# Patient Record
Sex: Female | Born: 1965 | Race: White | Hispanic: No | Marital: Married | Smoking: Former smoker
Health system: Southern US, Community
[De-identification: ages and names within clinical notes are randomized; demographics above are authoritative.]

## PROBLEM LIST (undated history)

## (undated) DIAGNOSIS — M1990 Unspecified osteoarthritis, unspecified site: Secondary | ICD-10-CM

## (undated) DIAGNOSIS — M4802 Spinal stenosis, cervical region: Secondary | ICD-10-CM

## (undated) DIAGNOSIS — F32A Depression, unspecified: Secondary | ICD-10-CM

## (undated) DIAGNOSIS — N809 Endometriosis, unspecified: Secondary | ICD-10-CM

## (undated) DIAGNOSIS — F419 Anxiety disorder, unspecified: Secondary | ICD-10-CM

## (undated) DIAGNOSIS — Z972 Presence of dental prosthetic device (complete) (partial): Secondary | ICD-10-CM

## (undated) DIAGNOSIS — F329 Major depressive disorder, single episode, unspecified: Secondary | ICD-10-CM

## (undated) DIAGNOSIS — I10 Essential (primary) hypertension: Secondary | ICD-10-CM

## (undated) DIAGNOSIS — R42 Dizziness and giddiness: Secondary | ICD-10-CM

## (undated) HISTORY — DX: Unspecified osteoarthritis, unspecified site: M19.90

## (undated) HISTORY — DX: Major depressive disorder, single episode, unspecified: F32.9

## (undated) HISTORY — PX: JOINT REPLACEMENT: SHX530

## (undated) HISTORY — DX: Spinal stenosis, cervical region: M48.02

## (undated) HISTORY — DX: Dizziness and giddiness: R42

## (undated) HISTORY — DX: Depression, unspecified: F32.A

## (undated) HISTORY — DX: Endometriosis, unspecified: N80.9

## (undated) HISTORY — DX: Essential (primary) hypertension: I10

---

## 2000-03-15 ENCOUNTER — Other Ambulatory Visit: Admission: RE | Admit: 2000-03-15 | Discharge: 2000-03-15 | Payer: Self-pay | Admitting: Obstetrics and Gynecology

## 2000-03-19 ENCOUNTER — Inpatient Hospital Stay (HOSPITAL_COMMUNITY): Admission: AD | Admit: 2000-03-19 | Discharge: 2000-03-19 | Payer: Self-pay | Admitting: Obstetrics and Gynecology

## 2000-09-05 ENCOUNTER — Other Ambulatory Visit: Admission: RE | Admit: 2000-09-05 | Discharge: 2000-09-05 | Payer: Self-pay | Admitting: Obstetrics and Gynecology

## 2001-01-01 ENCOUNTER — Observation Stay (HOSPITAL_COMMUNITY): Admission: RE | Admit: 2001-01-01 | Discharge: 2001-01-02 | Payer: Self-pay | Admitting: Obstetrics and Gynecology

## 2001-01-01 ENCOUNTER — Encounter (INDEPENDENT_AMBULATORY_CARE_PROVIDER_SITE_OTHER): Payer: Self-pay

## 2001-02-28 HISTORY — PX: TOTAL ABDOMINAL HYSTERECTOMY: SHX209

## 2002-02-28 HISTORY — PX: BACK SURGERY: SHX140

## 2002-07-02 ENCOUNTER — Encounter: Admission: RE | Admit: 2002-07-02 | Discharge: 2002-07-02 | Payer: Self-pay | Admitting: Neurosurgery

## 2002-07-02 ENCOUNTER — Encounter: Payer: Self-pay | Admitting: Neurosurgery

## 2002-08-08 ENCOUNTER — Inpatient Hospital Stay (HOSPITAL_COMMUNITY): Admission: RE | Admit: 2002-08-08 | Discharge: 2002-08-10 | Payer: Self-pay | Admitting: Neurosurgery

## 2002-08-08 ENCOUNTER — Encounter: Payer: Self-pay | Admitting: Neurosurgery

## 2002-11-28 ENCOUNTER — Encounter: Admission: RE | Admit: 2002-11-28 | Discharge: 2002-11-28 | Payer: Self-pay | Admitting: Neurosurgery

## 2002-11-28 ENCOUNTER — Encounter: Payer: Self-pay | Admitting: Neurosurgery

## 2004-05-19 ENCOUNTER — Emergency Department (HOSPITAL_COMMUNITY): Admission: EM | Admit: 2004-05-19 | Discharge: 2004-05-19 | Payer: Self-pay | Admitting: Emergency Medicine

## 2004-11-17 ENCOUNTER — Encounter: Admission: RE | Admit: 2004-11-17 | Discharge: 2004-11-17 | Payer: Self-pay | Admitting: Obstetrics and Gynecology

## 2004-12-02 ENCOUNTER — Other Ambulatory Visit: Admission: RE | Admit: 2004-12-02 | Discharge: 2004-12-02 | Payer: Self-pay | Admitting: Obstetrics and Gynecology

## 2008-08-22 ENCOUNTER — Ambulatory Visit: Payer: Self-pay | Admitting: Internal Medicine

## 2008-09-04 ENCOUNTER — Ambulatory Visit: Payer: Self-pay | Admitting: Internal Medicine

## 2010-04-01 NOTE — Miscellaneous (Signed)
Summary: PREVISIT/PREP/KCO  Clinical Lists Changes  Medications: Added new medication of MIRALAX   POWD (POLYETHYLENE GLYCOL 3350) As per prep  instructions. - Signed Added new medication of REGLAN 10 MG  TABS (METOCLOPRAMIDE HCL) As per prep instructions. - Signed Rx of MIRALAX   POWD (POLYETHYLENE GLYCOL 3350) As per prep  instructions.;  #255gm x 0;  Signed;  Entered by: Durwin Glaze RN;  Authorized by: Iva Boop MD;  Method used: [facility]  [place] [address], [place], [place]  [address], Ph: [phone] or [phone], Fax: [phone] Rx of REGLAN 10 MG  TABS (METOCLOPRAMIDE HCL) As per prep instructions.;  #2 x 0;  Signed;  Entered by: Durwin Glaze RN;  Authorized by: Iva Boop MD;  Method used: [facility]  [place] [address], [place], [place]  [address], Ph: [phone] or [phone], Fax: [phone] Observations: Added new observation of NKA: T (08/22/2008 15:15)    Prescriptions: REGLAN 10 MG  TABS (METOCLOPRAMIDE HCL) As per prep instructions.  #2 x 0   Entered by:   Durwin Glaze RN   Authorized by:   Iva Boop MD   Signed by:   Durwin Glaze RN on 08/22/2008   Method used:   [facility]        [facility]  [place] [address] (retail)       [address]       [place], [place]  [address]       Ph: [phone] or [phone]       Fax: [phone]   RxID:   [id number] MIRALAX   POWD (POLYETHYLENE GLYCOL 3350) As per prep  instructions.  #255gm x 0   Entered by:   Durwin Glaze RN   Authorized by:   Iva Boop MD   Signed by:   Durwin Glaze RN on 08/22/2008   Method used:   [facility]        [facility]  [place] [address] [facility]retail[facility]       [address]       [place], [place]  [address]       Ph: [phone] or [phone]       Fax: [phone]   RxID:   [id number]

## 2010-04-01 NOTE — Procedures (Signed)
Summary: Colonoscopy   Colonoscopy  Procedure date:  09/04/2008  Findings:      [facility]    Procedures Next Due Date:    Colonoscopy: 08/2013  COLONOSCOPY PROCEDURE REPORT  PATIENT:  Taylor Gillespie, Taylor Gillespie  MR#:  [id number] BIRTHDATE:   January 30, 1966, 42 yrs. old   GENDER:   female  ENDOSCOPIST:   Iva Boop, MD, [facility] Referred by: Richardean Chimera, M.D.  PROCEDURE DATE:  09/04/2008 PROCEDURE:  Colonoscopy [address] ASA CLASS:   Class I INDICATIONS: Elevated Risk Screening family history of colon cancer, father in 36's  MEDICATIONS:    Fentanyl 75 mcg IV, Versed 10 mg IV  DESCRIPTION OF PROCEDURE:   After the risks benefits and alternatives of the procedure were thoroughly explained, informed consent was obtained.  Digital rectal exam was performed and revealed no abnormalities.   The LB PCF-H180AL [id number] endoscope was introduced through the anus and advanced to the cecum in 2:43 minutes, which was identified by both the appendix and ileocecal valve, without limitations.  The quality of the prep was excellent, using MiraLax.  The instrument was then slowly withdrawn as the colon was fully examined in 9:10 minutes. <<PROCEDUREIMAGES>>      <<OLD IMAGES>>  FINDINGS:  A normal appearing cecum, ileocecal valve, and appendiceal orifice were identified. The ascending, hepatic flexure, transverse, splenic flexure, descending, sigmoid colon, and rectum appeared unremarkable.   Retroflexed views in the rectum revealed external hemorrhoids.    The scope was then withdrawn from the patient and the procedure completed.  COMPLICATIONS:   None  ENDOSCOPIC IMPRESSION:  1) Normal colon, excellent prep  2) External hemorrhoids in the rectum RECOMMENDATIONS:  Call [phone] to arrange appointment with Dr. Leone Payor re: GERD problems.  REPEAT EXAM:   In 5 year(s) for routine colonoscopy for family history of colon cancer.    Iva Boop, MD, [facility]  CC: The  Patient Richardean Chimera, MD Herb Grays, MD

## 2010-07-16 NOTE — Op Note (Signed)
[facility]  Patient:    Taylor Gillespie, Taylor Gillespie Visit Number: [id number] MRN: [id number]          Service Type: Attending:  Juluis Mire, M.D. Dictated by:   Juluis Mire, M.D. Proc. Date: 01/01/01                             Operative Report  PREOPERATIVE DIAGNOSES:       Pelvic pain, abnormal bleeding.  POSTOPERATIVE DIAGNOSIS:      Adenomyosis.  OPERATIVE PROCEDURE:          Laparoscopically assisted vaginal hysterectomy.  SURGEON:                      Juluis Mire, M.D.  ASSISTANTFreddy Finner, M.D.  ANESTHESIA:                   General endotracheal.  ESTIMATED BLOOD LOSS:         300 cc.  PACKS AND DRAINS:             None.  BLOOD REPLACEMENT:            None.  COMPLICATIONS:                None.  INDICATIONS:                  Are as dictated in history and physical.  PROCEDURE:                    The patient was taken to the OR and placed in a supine position.  After a satisfactory level of general endotracheal anesthesia was obtained the patient was placed in the dorsal lithotomy position using Allen stirrups.  The abdomen, perineum, and vagina were prepped out with Betadine and draped as a sterile field.  A speculum was placed in the vaginal vault.  The cervix was grasped with a single tooth tenaculum.  A Hulka tenaculum was then put in place.  Bladder was emptied with in-and-out catheterization.  Subumbilical incision was made with the knife.  Veress needle was introduced into the abdominal cavity.  The abdomen was insufflated with approximately 2 L of carbon dioxide.  The operating laparoscope was introduced.  Visualization revealed no evidence of injury to adjacent organs. A 5 mm trocar was put in place under direct visualization.  Appendix was normal.  Upper abdomen including liver and tip of the gallbladder were clear. Uterus was enlarged.  Tubes and ovaries were unremarkable.  There was no active  endometriosis or pelvic adhesions.  A third 5 mm trocar was put into place in the left lower quadrant after we did visualize the course of the epigastric vessels.  Next, a 5 mm laparoscope was brought into place in the left lower quadrant.  The LigaSure was then brought through the subumbilical port.  Using the LigaSure we were able to free up the left adnexa and the right adnexa from the uterus.  Next, the 5 mm scope was removed and we replaced the larger laparoscope to the subumbilical port.  Both adnexa were adequately released from the uterus.  There was no active bleeding and no evidence of injury to adjacent organs.  We thoroughly irrigate the pelvis.  At this point in time the  abdomen was desufflated of its carbon dioxide.  The laparoscope was removed.  The patients legs were then repositioned.  A weighted vaginal speculum was then placed in the vaginal vault.  The Hulka tenaculum was then removed.  The cervix was grasped with a Christella Hartigan tenaculum.  Cul-de-sac was entered sharply. Both uterosacral ligaments were clamped, cut, and suture ligated with 0 Vicryl.  The reflection of the vaginal mucosa anteriorly was then incised and the bladder was dissected superiorly.  Paracervical tissue was clamped, cut, and suture ligated with 0 Vicryl.  Vesicouterine space was entered sharply and the retractor was put in place to retract the bladder superiorly using the clamp, cut, and tie technique with suture ligatures of 0 Vicryl with parametrium serially separated from the sides of the uterus.  Uterus was then flipped, the remaining pedicle clamped and cut, and the uterus passed off the operative field.  Held pedicles were suture ligated with 0 Vicryl.  We had excellent hemostasis.  A uterosacral plication stick was performed using 0 Vicryl.  Vaginal mucosa was then closed in a vertical fashion with interrupted figure-of-eights of 0 Vicryl.  We had good hemostasis.  The weighted speculum was then  removed.  A sponge on a sponge stick was placed in the vaginal vault. The Foley was placed to straight drain retrieving an adequate amount of clear urine.  Patients legs were then repositioned.  The abdomen was reinsufflated with carbon dioxide.  Visualization with laparoscope and irrigation revealed no active bleeding.  All pedicles were intact and there was no evidence of injury to adjacent organs.  Abdomen was desufflated of its carbon dioxide.  All trocars were removed.  Subumbilical incision was closed with interrupted subcuticulars of 4-0 Vicryl.  Suprapubic incision was closed with Steri-Strips.  The sponge on a sponge stick was removed from the vaginal vault and the patient taken out of the dorsal lithotomy position.  Sponge, instrument, needle counts reported as correct by circulating nurse x 2.  The patient was extubated and transferred to recovery room in good condition. Dictated by:   Juluis Mire, M.D. Attending:  Juluis Mire, M.D. DD:  01/02/01 TD:  01/03/01 Job: [id number]

## 2010-07-16 NOTE — Discharge Summary (Signed)
[facility]  Patient:    Taylor Gillespie, Taylor Gillespie Visit Number: [id number] MRN: [id number]          Service Type: DSU Location: 910A 9121 01 Attending Physician:  Frederich Balding Dictated by:   Juluis Mire, M.D. Admit Date:  01/01/2001 Disc. Date: 01/02/01                             Discharge Summary  ADMITTING DIAGNOSES:          1. Pelvic pain.                               2. Abnormal bleeding.  DISCHARGE DIAGNOSES:          Adenomyosis with pathology pending.  OPERATIVE PROCEDURE:          Laparoscopically assisted vaginal hysterectomy.  HISTORY AND PHYSICAL:         For complete history and physical, please see dictated note.  HOSPITAL COURSE:              The patient underwent the above-noted surgery. Pathology is still pending. Postoperatively, the patient did well. Postoperative hemoglobin was 11.5. She was discharged home on her first postoperative day. At that time, she was tolerating liquids. Her abdominal exam was benign. She had no active bleeding and was voiding without difficulty.  In terms of complications, none were encountered during stay in the hospital. The patient was discharged home in stable condition.  DISPOSITION:                  Routine postoperative instructions have already been given. She is to avoid heavy lifting, vaginal entrance, or driving a car. She is to report increasing fever, nausea, vomiting, abdominal pain, or active vaginal bleeding.  DISCHARGE MEDICATIONS:        The patient may use Tylox as needed for pain.  DISCHARGE FOLLOWUP:           The patient is to follow up in the office in one week. Dictated by:   Juluis Mire, M.D. Attending Physician:  Frederich Balding DD:  01/02/01 TD:  01/02/01 Job: [id number]

## 2010-07-16 NOTE — Op Note (Signed)
NAME:  Taylor Gillespie, REIGER                       ACCOUNT NO.:  [id number]   MEDICAL RECORD NO.:  [id number]                   PATIENT TYPE:  INP   LOCATION:  3010                                 FACILITY:  [facility]   PHYSICIAN:  Donalee Citrin, M.D.                     DATE OF BIRTH:  [birth year]   DATE OF PROCEDURE:  08/08/2002  DATE OF DISCHARGE:                                 OPERATIVE REPORT   PREOPERATIVE DIAGNOSIS:  Grade 1 spondylolisthesis, L5-S1, with bilateral L5  radiculopathy and spinal instability.   PROCEDURES:  1. Decompressive laminectomy, L5-S1.  2. Gill decompression of the L5 nerve roots at L5.  3. Pedicle screw fixation at L5-S1.  4. Open reduction of spinal deformity, L5-S1.  5. Posterolateral arthrodesis, L5-S1, using locally-harvested autograft.   Instrumentation was Legacy M8 pedicle screw system with 6.5 x 40 pedicle  screws at L5 and 6.5 x 35 at S1, placement of a 322 crosslink.   SURGEON:  Donalee Citrin, M.D.   ASSISTANT:  Reinaldo Meeker, M.D.   ANESTHESIA:  General endotracheal.   HISTORY OF PRESENT ILLNESS:  The patient is a very pleasant 45 year old  female who has had long-standing back and leg pain consistent with an L5  radiculopathy.  The patient has been refractory to conservative treatment  with injections and physical therapy.  The pain got progressively worse over  the last several months.  Preop imaging showed grade 1 spondylolisthesis  with biforaminal stenosis at L5 and movement on flexion-extension films.  Due to failure of conservative treatment and her degree of instability, the  patient was recommended a decompression and stabilization procedure.  I  extensively went over the risks and benefits of surgery with her.  She  understands and agreed to proceed forward.   The patient was brought in the OR, was induced under general anesthesia and  positioned prone on the Wilson frame.  The back was prepped and draped in  the usual sterile  fashion.  Preoperative imaging localized the L5-S1 disk  space.  A midline incision was made after infiltration of 10 mL of lidocaine  with epinephrine.  Bovie electrocautery was used to take down through the  subcutaneous tissues.  Subperiosteal dissection carried out on the laminae  of L5 and S1 out to the TPs of L5 and S1 bilaterally.  The laminar complex  of L5 was noted to be hypermobile and very loose.  This was all removed in a  piecemeal fashion with a Leksell rongeur and Kerrison punches, exposing the  thecal sac.  The thecal sac was then decompressed and the L5 and S1 nerve  roots were identified and radically decompressed out their neural foramina.  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  Then the interspace was  identified, epidural veins were coagulated.  Attention was first  taken to  pedicle screw placement.  Using a high-speed drill, the pilot holes were  drilled, cannulated with the awl, tapped with a 5.5 tap, probed, and a 6.5 x  40 pedicle screw inserted at L5 bilaterally.  This procedure was repeated at  S1 with a 6.5 x 35 pedicle screw inserted.  Fluoroscopy confirmed the depth  and trajectory at each step along the way.  All the pedicles were probed and  noted to be competent in 360 degree orientation and direct intracanalicular  inspection confirmed no medial breach.  Then attention taken to the  interbody work.  The D'Errico nerve root retractor was used to reflect the  S1 nerve root medially.  The interspace on the right was radically cleaned  out and a size 12 distractor was inserted, fluoroscopy confirming this to be  the proper size of the bone graft.  Then on the left side the disk space was  cleaned out, cut, and chiseled with a size 12 chisel.  Then a 12 x 24 mm  Tangent allograft was inserted on the left.  Then attention was taken to the  right side.  Again the interspace was  prepared to receive the bone after  using a size 12 cutter and chisel and a downgoing Epstein curette, scraping  off the central end plates.  Locally-harvested autograft was packed against  the allograft on the left side.  On the right side a 12 x 24 mm Tangent  allograft was inserted.  Both allografts were inserted approximately 1-2 mm  deep to the posterior vertebral body line.  Position and depth confirmed  with fluoroscopy.  Then the wound was copiously irrigated.  Aggressive  decortication was carried out in the TPs bilaterally and the lateral  gutters.  During the interbody work the spondylolisthesis was reduced to  approximately half of its original slip, and then locally-harvested  autograft was packed down in the lateral gutters and along the TPs and the  size 40 mm rod was inserted, the top-tightening nuts were tightened down at  S1, and the L5 pedicle screw was compressed against S1.  The 322 crosslink  was placed.  The construct was noted to be solid.  The bone grafts were in  good position.  Both neural foramina were re-explored with an angled hockey  stick and noted to have no further stenosis.  No graft material was  appreciated in the canal.  Gelfoam was overlaid on top of the dura.  A  medium Hemovac drain was placed.  The muscle and fascia were reapproximated  with 0 interrupted Vicryl, the subcutaneous tissue was closed with 2-0  interrupted Vicryl, and the skin was closed with running 4-0 subcuticular.  Benzoin and Steri-Strips were applied.  The patient went to the recovery  room in stable condition.  At the end of the case all needle counts and  sponge counts were correct.                                               Donalee Citrin, M.D.    GC/MEDQ  D:  08/08/2002  T:  08/09/2002  Job:  [id number]

## 2010-07-16 NOTE — H&P (Signed)
[facility]  Patient:    Taylor Gillespie, Taylor Gillespie Visit Number: [id number] MRN: [id number]          Service Type: Attending:  Juluis Mire, M.D. Dictated by:   Juluis Mire, M.D. Adm. Date:  01/01/01                           History and Physical  HISTORY OF PRESENT ILLNESS:   The patient is a 45 year old gravida 1 para 1 married white female who presents for laparoscopically-assisted vaginal hysterectomy.  The patient has been troubled with increasing pelvic pain and abnormal bleeding.  She has been on birth control pills but having fairly significant abnormal periods with this.  She also has trouble with significant pain, particularly with intercourse.  This has become extremely limiting.  Pelvic ultrasounds have revealed simple ovarian cysts that have subsequently resolved but no active process.  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  It has been unresponsive to birth control pills.  The patient was desirous of proceeding with further management.  We have discussed with her further medical management versus outpatient laparoscopy versus definitive therapy in the form of hysterectomy, for which she is admitted at the present time.  ALLERGIES:                    No known drug allergies.  MEDICATIONS:                  Includes Celexa and birth control pills.  PAST MEDICAL HISTORY:         Usual childhood diseases without any significant sequela.  PAST SURGICAL HISTORY:        She has had no previous surgical history.  OBSTETRICAL HISTORY:          She has had one previous spontaneous vaginal delivery.  SOCIAL HISTORY:               Does reveal one pack every 20 days for past 17 years.  Occasional alcohol use.  FAMILY HISTORY:               Noncontributory.  REVIEW OF SYSTEMS:            Noncontributory.  PHYSICAL EXAMINATION:  VITAL SIGNS:                  Patient is afebrile with stable vital signs.  HEENT:                         Patient normocephalic.  Pupils equal, round, and reactive to light and accommodation.  Extraocular movements were intact. Sclerae and conjunctivae were clear.  Oropharynx clear.  NECK:                         Without thyromegaly.  BREASTS:                      No discrete masses.  LUNGS:                        Clear.  CARDIOVASCULAR:               Regular rate and rhythm, no murmurs or gallops.  ABDOMEN:                      Benign.  No masses, organomegaly, or tenderness.  PELVIC:                       Normal external genitalia, vaginal mucosa is clear.  Cervix unremarkable.  Uterus upper limits of normal size, moderate tenderness.  Adnexa unremarkable.  EXTREMITIES:                  Trace edema.  NEUROLOGIC:                   Grossly within normal limits.  IMPRESSION:                   Abnormal uterine bleeding and pelvic pain thought to be secondary to pelvic endometriosis.  PLAN:                         Again, options have been discussed, including further use of hormonal agents versus outpatient surgical management.  The patient is desirous of proceeding with definitive therapy in the form of laparoscopically-assisted vaginal hysterectomy.  The risks of surgery have been discussed, including the risk of anesthesia; the risk of infection; the risk of hemorrhage that could necessitate transfusion with the risk of AIDS or hepatitis; the risk of injury to adjacent organs including bladder, bowel, or ureters that could require further exploratory surgery; the risk of deep venous thrombosis and pulmonary embolus.  The patient professed an understanding of the indications and risks and is accepting of them.         The risks have been discussed, including the risk of anesthesia  the risk of infection  the risk of hemorrhage that could require transfusion with the risk of AIDS or hepatitis  the risk of injury to adjacent organs including bladder, bowel, or  ureters that could require further exploratory surgery  the risk of deep venous thrombosis and pulmonary embolus.  The patient professed an understanding of the indications and risks. Dictated by:   Juluis Mire, M.D. Attending:  Juluis Mire, M.D. DD:  01/01/01 TD:  01/01/01 Job: [id number]

## 2013-10-02 ENCOUNTER — Encounter: Payer: Self-pay | Admitting: Internal Medicine

## 2014-04-09 ENCOUNTER — Encounter: Payer: Self-pay | Admitting: Physician Assistant

## 2014-04-15 ENCOUNTER — Ambulatory Visit: Payer: Self-pay | Admitting: Physician Assistant

## 2014-04-24 ENCOUNTER — Encounter: Payer: Self-pay | Admitting: *Deleted

## 2014-04-25 ENCOUNTER — Ambulatory Visit (INDEPENDENT_AMBULATORY_CARE_PROVIDER_SITE_OTHER): Payer: BLUE CROSS/BLUE SHIELD | Admitting: Physician Assistant

## 2014-04-25 ENCOUNTER — Encounter: Payer: Self-pay | Admitting: Physician Assistant

## 2014-04-25 VITALS — BP 148/94 | HR 72 | Ht 67.0 in | Wt 260.0 lb

## 2014-04-25 DIAGNOSIS — Z800 Family history of malignant neoplasm of digestive organs: Secondary | ICD-10-CM

## 2014-04-25 DIAGNOSIS — F329 Major depressive disorder, single episode, unspecified: Secondary | ICD-10-CM

## 2014-04-25 DIAGNOSIS — I10 Essential (primary) hypertension: Secondary | ICD-10-CM | POA: Insufficient documentation

## 2014-04-25 DIAGNOSIS — R1314 Dysphagia, pharyngoesophageal phase: Secondary | ICD-10-CM

## 2014-04-25 DIAGNOSIS — F32A Depression, unspecified: Secondary | ICD-10-CM | POA: Insufficient documentation

## 2014-04-25 DIAGNOSIS — Z1211 Encounter for screening for malignant neoplasm of colon: Secondary | ICD-10-CM

## 2014-04-25 DIAGNOSIS — K219 Gastro-esophageal reflux disease without esophagitis: Secondary | ICD-10-CM

## 2014-04-25 DIAGNOSIS — G8929 Other chronic pain: Secondary | ICD-10-CM | POA: Insufficient documentation

## 2014-04-25 DIAGNOSIS — M549 Dorsalgia, unspecified: Secondary | ICD-10-CM

## 2014-04-25 MED ORDER — POLYETHYLENE GLYCOL 3350 17 GM/SCOOP PO POWD: 1.0000 | Freq: Every day | ORAL | Status: DC

## 2014-04-25 MED ORDER — PANTOPRAZOLE SODIUM 40 MG PO TBEC: 40.0000 mg | DELAYED_RELEASE_TABLET | Freq: Every day | ORAL | Status: DC

## 2014-04-25 NOTE — Progress Notes (Signed)
Patient ID: Taylor Gillespie, female   DOB: 11-10-1965, 49 y.o.   MRN: [id number]   Subjective:    Patient ID: Taylor Gillespie, female    DOB: 02-Jan-1966, 49 y.o.   MRN: [id number]  HPI Taylor Gillespie is a 49 year old white female known to Dr. Leone Payor from prior colonoscopy which was done in 2010 for screening. She has family history of colon cancer in her father diagnosed in his early 76s. She had a normal exam and was recommended to have 5 year interval follow-up. She comes in today for a new problem of chronic acid reflux. She says she has been having problems for years She had been placed on Protonix 40 mg by mouth every morning recently by her PCP and says that her heartburn and indigestion is now under good control. She says she still has to walk certain foods like caffeine coffee fried foods etc. area she had been having some problems with nausea and occasional vomiting and sour brash but those have resolved as well. Patient has been having dysphagia to pills and occasionally with dry foods especially bread and meat. She does not have regurgitation but generally has to stop eating and says the food will gradually go down. This seems to have increased over the past several months. She is worried that something is "wrong in her esophagus". She's also been having some sternal discomfort and chest discomfort which she says may be musculoskeletal. This started after she had done a lot of cleaning 1 day. Line She has no complaints of changes in her bowel habits does tend to have mild constipation. No melena or hematochezia. She tends to take NSAIDs on a regular basis for chronic back pain. She had been on Mobic which she has stopped and then switched from ibuprofen 2 naproxen because she thought this might be better for her stomach is generally taking this on a daily basis.  Review of Systems Pertinent positive and negative review of systems were noted in the above HPI section.  All other review of systems  was otherwise negative.  Outpatient Encounter Prescriptions as of 04/25/2014  Medication Sig  . calcium citrate-vitamin D (CITRACAL+D) 315-200 MG-UNIT per tablet Take 1 tablet by mouth 2 (two) times daily.  . cholecalciferol (VITAMIN D) 1000 UNITS tablet Take 1,000 Units by mouth daily. Take  2 tab daily  . FLUoxetine (PROZAC) 20 MG tablet Take 20 mg by mouth daily.  . fluticasone (FLONASE) 50 MCG/ACT nasal spray Place into both nostrils daily.  . hydrochlorothiazide (HYDRODIURIL) 25 MG tablet Take 25 mg by mouth daily. Take 1 tab as needed.  Marland Kitchen ibuprofen (ADVIL,MOTRIN) 200 MG tablet Take 200 mg by mouth every 6 (six) hours as needed.  . meloxicam (MOBIC) 15 MG tablet Take 15 mg by mouth daily.  . naproxen (NAPROSYN) 500 MG tablet Take 500 mg by mouth 2 (two) times daily with a meal.  . pantoprazole (PROTONIX) 40 MG tablet Take 1 tablet (40 mg total) by mouth daily.  . [DISCONTINUED] pantoprazole (PROTONIX) 40 MG tablet   . polyethylene glycol powder (GLYCOLAX/MIRALAX) powder Take 255 g by mouth daily.  . [DISCONTINUED] omeprazole (PRILOSEC OTC) 20 MG tablet Take 20 mg by mouth daily.  . [DISCONTINUED] Vitamin D, Ergocalciferol, (DRISDOL) 50000 UNITS CAPS capsule Take 50,000 Units by mouth every 7 (seven) days.   Allergies  Allergen Reactions  . Shellfish Allergy     Sea food allergy.   Patient Active Problem List   Diagnosis Date Noted  . HTN (  hypertension) 04/25/2014  . Depression 04/25/2014  . Chronic back pain 04/25/2014   History   Social History  . Marital Status: Married    Spouse Name: N/A  . Number of Children: 1  . Years of Education: N/A   Occupational History  . [employer]    Social History Main Topics  . Smoking status: Current Every Day Smoker  . Smokeless tobacco: Never Used  . Alcohol Use: 0.0 oz/week    0 Standard drinks or equivalent per week  . Drug Use: No  . Sexual Activity: Not on file   Other Topics Concern  . Not on file   Social History Narrative     Ms. Debroux's family history includes Colon cancer in her father; Diabetes in her mother; Diabetes Mellitus II in her father; Hypertension in her brother and sister.      Objective:    Filed Vitals:   04/25/14 0831  BP: 148/94  Pulse: 72    Physical Exam  well-developed white female in no acute distress, pleasant blood pressure 148/94 pulse 72 height 5 foot 7 weight 260, BMI 40.7. HEENT; nontraumatic normocephalic EOMI PERRLA sclera anicteric, Neck; supple no JVD, Cardiovascular; regular rate and rhythm with S1-S2 no murmur or gallop, Pulmonary ;clear bilaterally, she is tender to palpation of the chest wall and midsternum, Abdomen; obese soft and basically nontender no palpable inguinal hernia no mass or hepatosplenomegaly bowel  sounds are present., Rectal ;exam not done, Extremities; no clubbing cyanosis or edema skin warm and dry, Psych ;mood and affect appropriate       Assessment & Plan:   #1  49 yo female with Chronic GERD #2 solid food Dysphagia- r/o stricture #3 Family hx of colon cancer (Father early 6960's) - due for 5 year interval follow up #4 Constipation  Plan; Continue Protonix 40 mg po daiily Antireflux regimen Schedule for EGD with probable dilation  , and Colonoscopy with Dr. Leone PayorGessner . Procedures discussed in detail with pt and she is agreeable to proceed. Discussed increasing fiber in diet ,liberal fluids, and prn stool softeners or Miralax   Taylor Gillespie S Shinichi Anguiano PA-C 04/25/2014

## 2014-04-25 NOTE — Patient Instructions (Signed)
We sent refills  For the Protonix 40 mg. You have been scheduled for an endoscopy and colonoscopy. Please follow the written instructions given to you at your visit today. Please pick up your prep supplies at the pharmacy within the next 1-3 days.  [facility] [address]. If you use inhalers (even only as needed), please bring them with you on the day of your procedure. Your physician has requested that you go to www.startemmi.com and enter the access code given to you at your visit today. This web site gives a general overview about your procedure. However, you should still follow specific instructions given to you by our office regarding your preparation for the procedure.

## 2014-04-27 NOTE — Progress Notes (Signed)
Agree with Ms. Esterwood's assessment and plan. Carl E. Gessner, MD, FACG   

## 2014-05-15 ENCOUNTER — Encounter: Payer: Self-pay | Admitting: Internal Medicine

## 2014-05-30 HISTORY — PX: COLONOSCOPY: SHX174

## 2014-05-30 HISTORY — PX: UPPER GI ENDOSCOPY: SHX6162

## 2014-06-13 ENCOUNTER — Ambulatory Visit (AMBULATORY_SURGERY_CENTER): Payer: BLUE CROSS/BLUE SHIELD | Admitting: Internal Medicine

## 2014-06-13 ENCOUNTER — Encounter: Payer: Self-pay | Admitting: Internal Medicine

## 2014-06-13 VITALS — BP 114/78 | HR 73 | Temp 98.5°F | Resp 16

## 2014-06-13 DIAGNOSIS — Z1211 Encounter for screening for malignant neoplasm of colon: Secondary | ICD-10-CM

## 2014-06-13 DIAGNOSIS — K219 Gastro-esophageal reflux disease without esophagitis: Secondary | ICD-10-CM

## 2014-06-13 DIAGNOSIS — Z800 Family history of malignant neoplasm of digestive organs: Secondary | ICD-10-CM | POA: Diagnosis not present

## 2014-06-13 DIAGNOSIS — R1314 Dysphagia, pharyngoesophageal phase: Secondary | ICD-10-CM | POA: Diagnosis not present

## 2014-06-13 MED ORDER — SODIUM CHLORIDE 0.9 % IV SOLN
500.0000 mL | INTRAVENOUS | Status: DC
Start: 2014-06-13 — End: 2014-06-16

## 2014-06-13 NOTE — Progress Notes (Signed)
Report to PACU, RN, vss, BBS= Clear.  

## 2014-06-13 NOTE — Patient Instructions (Addendum)
Both exams were normal.  Stay on the pantoprazole (Protonix) to control heartburn.  Try to quit smoking - it will help you in many ways if you can.  Your next routine colonoscopy should be in 5 years - 2021.  I appreciate the opportunity to care for you. Iva Booparl E. Zyiah Withington, MD, FACG   YOU HAD AN ENDOSCOPIC PROCEDURE TODAY AT THE [facility]:   Refer to the procedure report that was given to you for any specific questions about what was found during the examination.  If the procedure report does not answer your questions, please call your gastroenterologist to clarify.  If you requested that your care partner not be given the details of your procedure findings, then the procedure report has been included in a sealed envelope for you to review at your convenience later.  YOU SHOULD EXPECT: Some feelings of bloating in the abdomen. Passage of more gas than usual.  Walking can help get rid of the air that was put into your GI tract during the procedure and reduce the bloating. If you had a lower endoscopy (such as a colonoscopy or flexible sigmoidoscopy) you may notice spotting of blood in your stool or on the toilet paper. If you underwent a bowel prep for your procedure, you may not have a normal bowel movement for a few days.  Please Note:  You might notice some irritation and congestion in your nose or some drainage.  This is from the oxygen used during your procedure.  There is no need for concern and it should clear up in a day or so.  SYMPTOMS TO REPORT IMMEDIATELY:   Following lower endoscopy (colonoscopy or flexible sigmoidoscopy):  Excessive amounts of blood in the stool  Significant tenderness or worsening of abdominal pains  Swelling of the abdomen that is new, acute  Fever of 100F or higher   Following upper endoscopy (EGD)  Vomiting of blood or coffee ground material  New chest pain or pain under the shoulder blades  Painful or persistently difficult  swallowing  New shortness of breath  Fever of 100F or higher  Black, tarry-looking stools  For urgent or emergent issues, a gastroenterologist can be reached at any hour by calling [phone].   DIET: Your first meal following the procedure should be a small meal and then it is ok to progress to your normal diet. Heavy or fried foods are harder to digest and may make you feel nauseous or bloated.  Likewise, meals heavy in dairy and vegetables can increase bloating.  Drink plenty of fluids but you should avoid alcoholic beverages for 24 hours.  ACTIVITY:  You should plan to take it easy for the rest of today and you should NOT DRIVE or use heavy machinery until tomorrow (because of the sedation medicines used during the test).    FOLLOW UP: Our staff will call the number listed on your records the next business day following your procedure to check on you and address any questions or concerns that you may have regarding the information given to you following your procedure. If we do not reach you, we will leave a message.  However, if you are feeling well and you are not experiencing any problems, there is no need to return our call.  We will assume that you have returned to your regular daily activities without incident.  If any biopsies were taken you will be contacted by phone or by letter within the next 1-3 weeks.  Please call us at [phone] if you have not heard about the biopsies in 3 weeks.    SIGNATURES/CONFIDENTIALITY: You and/or your care partner have signed paperwork which will be entered into your electronic medical record.  These signatures attest to the fact that that the information above on your After Visit Summary has been reviewed and is understood.  Full responsibility of the confidentiality of this discharge information lies with you and/or your care-partner.

## 2014-06-13 NOTE — Op Note (Signed)
[facility] [address] [place], [id number]   COLONOSCOPY PROCEDURE REPORT  PATIENT: Taylor Gillespie, Taylor A  MR#: [id number] BIRTHDATE: [birth year] , 48  yrs. old GENDER: female ENDOSCOPIST: Iva Booparl E Lexey Fletes, MD, [facility] PROCEDURE DATE:  06/13/2014 PROCEDURE:   Colonoscopy, screening First Screening Colonoscopy - Avg.  risk and is 50 yrs.  old or older - No.  Prior Negative Screening - Now for repeat screening. Less than 10 yrs Prior Negative Screening - Now for repeat screening.  Above average risk  History of Adenoma - Now for follow-up colonoscopy & has been > or = to 3 yrs.  N/A ASA CLASS:   Class III INDICATIONS:Screening for colonic neoplasia and FH Colon or Rectal Adenocarcinoma. MEDICATIONS: Residual sedation present, Propofol 90 mg IV, and Monitored anesthesia care  DESCRIPTION OF PROCEDURE:   After the risks benefits and alternatives of the procedure were thoroughly explained, informed consent was obtained.  The digital rectal exam revealed no abnormalities of the rectum.   The LB [id number]  endoscope was introduced through the anus and advanced to the cecum, which was identified by both the appendix and ileocecal valve. No adverse events experienced.   The quality of the prep was adequate (MiraLax was used)  The instrument was then slowly withdrawn as the colon was fully examined.      COLON FINDINGS: A normal appearing cecum, ileocecal valve, and appendiceal orifice were identified.  The ascending, transverse, descending, sigmoid colon, and rectum appeared unremarkable. Retroflexed views revealed no abnormalities. The time to cecum = 1.9 Withdrawal time = 7.9   The scope was withdrawn and the procedure completed. COMPLICATIONS: There were no immediate complications.  ENDOSCOPIC IMPRESSION: Normal colonoscopy  RECOMMENDATIONS: Repeat Colonoscopy in 5 years. (Family Hx CRCA)  eSigned:  Iva Booparl E Lailana Shira, MD, [facility] 06/13/2014 3:58 PM   cc: The  Patient

## 2014-06-13 NOTE — Op Note (Signed)
[facility] [address] [place], [id number]   ENDOSCOPY PROCEDURE REPORT  PATIENT: Taylor Gillespie, Taylor Gillespie  MR#: [id number] BIRTHDATE: 1965/07/11 , 48  yrs. old GENDER: female ENDOSCOPIST: Iva Booparl E Vonnetta Akey, MD, [facility] PROCEDURE DATE:  06/13/2014 PROCEDURE:  EGD, diagnostic ASA CLASS:     Class III INDICATIONS:  dysphagia. MEDICATIONS: Propofol 150 mg IV and Monitored anesthesia care TOPICAL ANESTHETIC: none  DESCRIPTION OF PROCEDURE: After the risks benefits and alternatives of the procedure were thoroughly explained, informed consent was obtained.  The LB [id number] endoscope was introduced through the mouth and advanced to the second portion of the duodenum , Without limitations.  The instrument was slowly withdrawn as the mucosa was fully examined.      EXAM: The esophagus and gastroesophageal junction were completely normal in appearance.  The stomach was entered and closely examined.The antrum, angularis, and lesser curvature were well visualized, including Gillespie retroflexed view of the cardia and fundus. The stomach wall was normally distensable.  The scope passed easily through the pylorus into the duodenum.  Retroflexed views revealed no abnormalities.     The scope was then withdrawn from the patient and the procedure completed.  COMPLICATIONS: There were no immediate complications.  ENDOSCOPIC IMPRESSION: Normal appearing esophagus and GE junction, the stomach was well visualized and normal in appearance, normal appearing duodenum  RECOMMENDATIONS: Continue PPI  every day - dysphagia resolved on PPI GERD diet stop smoking   eSigned:  Iva Booparl E Maybel Dambrosio, MD, [facility] 06/13/2014 3:56 PM    CC:The Patient

## 2014-06-16 ENCOUNTER — Telehealth: Payer: Self-pay | Admitting: *Deleted

## 2014-06-16 NOTE — Telephone Encounter (Signed)
No answer, message left for the patient. 

## 2014-07-25 ENCOUNTER — Encounter: Payer: Self-pay | Admitting: Internal Medicine

## 2015-05-30 ENCOUNTER — Other Ambulatory Visit: Payer: Self-pay | Admitting: Physician Assistant

## 2015-11-11 ENCOUNTER — Encounter: Payer: Self-pay | Admitting: Podiatry

## 2015-11-11 ENCOUNTER — Ambulatory Visit (INDEPENDENT_AMBULATORY_CARE_PROVIDER_SITE_OTHER): Payer: BLUE CROSS/BLUE SHIELD | Admitting: Podiatry

## 2015-11-11 ENCOUNTER — Ambulatory Visit (INDEPENDENT_AMBULATORY_CARE_PROVIDER_SITE_OTHER): Payer: BLUE CROSS/BLUE SHIELD

## 2015-11-11 VITALS — BP 141/94 | HR 92 | Resp 16 | Ht 67.0 in | Wt 239.0 lb

## 2015-11-11 DIAGNOSIS — M79671 Pain in right foot: Secondary | ICD-10-CM | POA: Diagnosis not present

## 2015-11-11 DIAGNOSIS — M79672 Pain in left foot: Secondary | ICD-10-CM

## 2015-11-11 DIAGNOSIS — R600 Localized edema: Secondary | ICD-10-CM | POA: Diagnosis not present

## 2015-11-11 DIAGNOSIS — M779 Enthesopathy, unspecified: Secondary | ICD-10-CM | POA: Diagnosis not present

## 2015-11-11 MED ORDER — TRIAMCINOLONE ACETONIDE 10 MG/ML IJ SUSP
10.0000 mg | Freq: Once | INTRAMUSCULAR | Status: AC
Start: 2015-11-11 — End: 2015-11-11
  Administered 2015-11-11: 10 mg

## 2015-11-11 NOTE — Progress Notes (Signed)
   Subjective:    Patient ID: Taylor Gillespie, female    DOB: [birth year], 50 y.o.   MRN: [id number]  HPI Chief Complaint  Patient presents with  . Foot Pain    Bilateral; plantar forefoot; back of heel; pt stated, "Has burning sensation on top and bottom of foot; swelling"; x3 weeks  . Rash    Right foot; dorsal; x2 weeks      Review of Systems  Constitutional: Positive for fatigue.  Endocrine: Positive for heat intolerance.  Musculoskeletal: Positive for gait problem.  All other systems reviewed and are negative.      Objective:   Physical Exam        Assessment & Plan:

## 2015-11-12 NOTE — Progress Notes (Signed)
Subjective:     Patient ID: Taylor Gillespie, female   DOB: 02-Sep-1965, 50 y.o.   MRN: [id number]  HPI patient states that she started to work on concrete floors and is developed increased pain in both feet with swelling and also moderate swelling in the ankles. States it's been really sore for the last few weeks   Review of Systems  All other systems reviewed and are negative.      Objective:   Physical Exam  Constitutional: She is oriented to person, place, and time.  Cardiovascular: Intact distal pulses.   Musculoskeletal: Normal range of motion.  Neurological: She is oriented to person, place, and time.  Skin: Skin is warm.  Nursing note and vitals reviewed.  neurovascular status intact muscle strength adequate range of motion within normal limits with patient found to have inflammatory changes third MPJ of the left foot second MPJ of the right foot with moderate pain in the adjacent metatarsals but not to the same degree. Mild elevation of the underlying digits is also noted and patient's found have good digital perfusion and is well oriented 3     Assessment:     Inflammatory capsulitis third MPJ left second MPJ right with pain and moderate metatarsalgia bilateral    Plan:     H&P and x-rays reviewed with patient and today I did a proximal nerve block of each area explaining procedure and risk and aspirated the third MPJ left second MPJ right getting out a small amount of clear fluid and then injected with quarter cc deck some Kenalog into each and applied padding. She is on a prednisone pack which she will stay on at this time and will be seen back and we'll probably require long-term orthotics  X-ray report indicates that there is slight digital deformities with no indications of fracture or arthritis

## 2015-11-25 ENCOUNTER — Ambulatory Visit (INDEPENDENT_AMBULATORY_CARE_PROVIDER_SITE_OTHER): Payer: BLUE CROSS/BLUE SHIELD | Admitting: Podiatry

## 2015-11-25 ENCOUNTER — Encounter: Payer: Self-pay | Admitting: Podiatry

## 2015-11-25 DIAGNOSIS — M79672 Pain in left foot: Secondary | ICD-10-CM

## 2015-11-25 DIAGNOSIS — M79671 Pain in right foot: Secondary | ICD-10-CM

## 2015-11-25 DIAGNOSIS — M779 Enthesopathy, unspecified: Secondary | ICD-10-CM

## 2015-11-26 NOTE — Progress Notes (Signed)
Subjective:     Patient ID: Taylor Gillespie, female   DOB: 04-01-1965, 50 y.o.   MRN: [id number]  HPI patient presents after having injections of the third left second right stating that she has less pain and inflammation than previously   Review of Systems     Objective:   Physical Exam Neurovascular status intact muscle strength adequate with inflammatory changes sub-capsules bilateral which is improved but still painful    Assessment:     Inflammatory capsulitis improving but painful    Plan:     Advised on physical therapy anti-inflammatories supportive shoes and reappoint to recheck

## 2017-11-08 ENCOUNTER — Other Ambulatory Visit: Payer: Self-pay

## 2017-11-08 ENCOUNTER — Emergency Department (HOSPITAL_COMMUNITY)
Admission: EM | Admit: 2017-11-08 | Discharge: 2017-11-08 | Disposition: A | Discharge status: Home / Self Care | Payer: BLUE CROSS/BLUE SHIELD | Attending: Emergency Medicine | Admitting: Emergency Medicine

## 2017-11-08 ENCOUNTER — Emergency Department (HOSPITAL_COMMUNITY): Payer: BLUE CROSS/BLUE SHIELD

## 2017-11-08 ENCOUNTER — Encounter (HOSPITAL_COMMUNITY): Payer: Self-pay | Admitting: *Deleted

## 2017-11-08 DIAGNOSIS — Z79899 Other long term (current) drug therapy: Secondary | ICD-10-CM | POA: Insufficient documentation

## 2017-11-08 DIAGNOSIS — I10 Essential (primary) hypertension: Secondary | ICD-10-CM | POA: Diagnosis not present

## 2017-11-08 DIAGNOSIS — M19041 Primary osteoarthritis, right hand: Secondary | ICD-10-CM | POA: Insufficient documentation

## 2017-11-08 DIAGNOSIS — M79641 Pain in right hand: Secondary | ICD-10-CM | POA: Diagnosis present

## 2017-11-08 DIAGNOSIS — F17200 Nicotine dependence, unspecified, uncomplicated: Secondary | ICD-10-CM | POA: Diagnosis not present

## 2017-11-08 MED ORDER — DEXAMETHASONE SODIUM PHOSPHATE 10 MG/ML IJ SOLN
10.0000 mg | Freq: Once | INTRAMUSCULAR | Status: AC
  Administered 2017-11-08: 10 mg via INTRAMUSCULAR
  Filled 2017-11-08: qty 1

## 2017-11-08 NOTE — ED Provider Notes (Signed)
Patient placed in Quick Look pathway, seen and evaluated   Chief Complaint: right hand pain  HPI:   Taylor Gillespie is a 52 y.o. female with hx of arthritis and and osteoporosis who presents to the ED with right thumb pain that started about a year ago and has gotten worse. She has tried a brace, taking Advil and has been to a [employer]or and the pain and swelling is worse. Patient tried to get appointment with them again but they could not see her. Patient getting ready to start a new job and needed to be seen before.   ROS: M/S: right hand pain  Physical Exam:  BP (!) 155/96 (BP Location: Left Arm)   Pulse 90   Temp 98.3 F (36.8 C) (Oral)   Resp 18   SpO2 98%    Gen: No distress  Neuro: Awake and Alert  Skin: Warm and dry  M/S: right thumb tender on exam, mild swelling, pain radiates to the right wrist. Radial pulse 2+, adequate circulation      Initiation of care has begun. The patient has been counseled on the process, plan, and necessity for staying for the completion/evaluation, and the remainder of the medical screening examination    Janne Napoleon, NP 11/08/17 1319    Vanetta Mulders, MD 11/11/17 [phone]

## 2017-11-08 NOTE — ED Notes (Signed)
Patient transported to X-ray 

## 2017-11-08 NOTE — ED Triage Notes (Signed)
Pt reports ongoing right thumb pain and swelling. Denies injury.

## 2017-11-08 NOTE — ED Provider Notes (Signed)
[facility] EMERGENCY DEPARTMENT Provider Note   CSN: [id number] Arrival date & time: 11/08/17  1230     History   Chief Complaint Chief Complaint  Patient presents with  . Hand Pain    HPI Taylor Gillespie is a 52 y.o. female with a past medical history of osteoarthritis and rheumatoid arthritis, hypertension, who presents to ED for evaluation of 1 day history of dominant right thumb pain radiating down her wrist.  States that she has had flareup of this pain several times in the past.  However, she is concerned because she starts a new job that would use hands on Monday.  She states that she tries to get in with her hand specialist but was unable to today.  She is requesting a steroid shot in the joint.  She thinks this will help her based on the fact that she has had in her foot before.  Denies any injuries or falls, numbness in hands or arms, history of gout, prior surgeries.  HPI  Past Medical History:  Diagnosis Date  . Arthritis   . Depression   . Endometriosis   . Hypertension     Patient Active Problem List   Diagnosis Date Noted  . HTN (hypertension) 04/25/2014  . Depression 04/25/2014  . Chronic back pain 04/25/2014    Past Surgical History:  Procedure Laterality Date  . BACK SURGERY  2004  . TOTAL ABDOMINAL HYSTERECTOMY  2003     OB History   None      Home Medications    Prior to Admission medications   Medication Sig Start Date End Date Taking? Authorizing Provider  calcium citrate-vitamin D (CITRACAL+D) 315-200 MG-UNIT per tablet Take 1 tablet by mouth 2 (two) times daily.    [provider]  cholecalciferol (VITAMIN D) 1000 UNITS tablet Take 1,000 Units by mouth daily. Take  2 tab daily    [provider]  FLUoxetine (PROZAC) 20 MG capsule  11/10/15   [provider]  ibuprofen (ADVIL,MOTRIN) 200 MG tablet Take 200 mg by mouth every 6 (six) hours as needed.    [provider]    lisinopril-hydrochlorothiazide (PRINZIDE,ZESTORETIC) 20-25 MG tablet  11/10/15   [provider]  naproxen (NAPROSYN) 500 MG tablet Take 500 mg by mouth 2 (two) times daily with a meal.    [provider]  pantoprazole (PROTONIX) 40 MG tablet TAKE 1 TABLET (40 MG TOTAL) BY MOUTH DAILY. 06/01/15   Iva Boop, MD  VIMOVO 500-20 MG TBEC  11/09/15   [provider]    Family History Family History  Problem Relation Age of Onset  . Colon cancer Father        deceased  . Diabetes Mellitus II Father   . Diabetes Mother        deceased  . Hypertension Brother   . Hypertension Sister        two sisters    Social History Social History   Tobacco Use  . Smoking status: Current Every Day Smoker  . Smokeless tobacco: Never Used  Substance Use Topics  . Alcohol use: Yes    Alcohol/week: 0.0 standard drinks  . Drug use: No     Allergies   Shellfish allergy   Review of Systems Review of Systems  Constitutional: Negative for chills and fever.  Musculoskeletal: Positive for arthralgias. Negative for joint swelling and myalgias.  Skin: Negative for wound.     Physical Exam Updated Vital Signs BP Marland Kitchen)  155/96 (BP Location: Left Arm)   Pulse 90   Temp 98.3 F (36.8 C) (Oral)   Resp 18   SpO2 98%   Physical Exam  Constitutional: She appears well-developed and well-nourished. No distress.  HENT:  Head: Normocephalic and atraumatic.  Eyes: Conjunctivae and EOM are normal. No scleral icterus.  Neck: Normal range of motion.  Pulmonary/Chest: Effort normal. No respiratory distress.  Musculoskeletal: She exhibits tenderness.  Tenderness to palpation of the metacarpal joint of the right thumb.  No significant erythema, edema or warmth of joint noted.  2+ radial pulse palpated.  Full active and passive range of motion of joints although limited due to pain at times.  Neurological: She is alert.  Skin: No rash noted. She is not diaphoretic.  Psychiatric:  She has a normal mood and affect.  Nursing note and vitals reviewed.    ED Treatments / Results  Labs (all labs ordered are listed, but only abnormal results are displayed) Labs Reviewed - No data to display  EKG None  Radiology Dg Hand Complete Right  Result Date: 11/08/2017 CLINICAL DATA:  Right thumb pain for 2 weeks, no known injury, initial encounter EXAM: RIGHT HAND - COMPLETE 3+ VIEW COMPARISON:  11/24/2009 FINDINGS: Mild degenerative changes noted at the base of the thumb involving the first [facility] joint. Interphalangeal degenerative changes are noted as well. These have progressed in the interval from the prior exam. Well corticated densities are noted adjacent to the first [facility] joint likely related to prior trauma. No acute fracture or dislocation is noted. No soft tissue changes are seen. IMPRESSION: Degenerative changes of the first [facility] joint are noted. No acute fracture is seen. Electronically Signed   By: Alcide Clever M.D.   On: 11/08/2017 14:32    Procedures Procedures (including critical care time)  Medications Ordered in ED Medications  dexamethasone (DECADRON) injection 10 mg (has no administration in time range)     Initial Impression / Assessment and Plan / ED Course  I have reviewed the triage vital signs and the nursing notes.  Pertinent labs & imaging results that were available during my care of the patient were reviewed by me and considered in my medical decision making (see chart for details).     52 year old female presents to ED for evaluation of flareup of pain from osteoarthritis and rheumatoid arthritis of her right thumb.  Symptoms have been going on since yesterday.  She was unable to get up with her hand specialist and is concerned because she starts a new job on Monday.  On exam there is no signs of infectious or vascular cause of symptoms.  She is able to move the wrist and digit without difficulty although there is some pain with certain movements.   X-ray shows degenerative changes of the joint.  No acute findings.  Will give patient IM Decadron to help systemically with inflammation other joints as well.  Will advise her to return to ED for any severe worsening symptoms.  Portions of this note were generated with Scientist, clinical (histocompatibility and immunogenetics). Dictation errors may occur despite best attempts at proofreading.   Final Clinical Impressions(s) / ED Diagnoses   Final diagnoses:  Primary osteoarthritis of right hand    ED Discharge Orders    None       Dietrich Pates, PA-C 11/08/17 1501    Virgina Norfolk, DO 11/08/17 2120

## 2017-11-08 NOTE — ED Notes (Signed)
Signature pad unavailable at time of pt discharge. Pt verbalized understanding of d/c instructions/prescriptions.  

## 2017-11-08 NOTE — Discharge Instructions (Signed)
Return to ED for worsening symptoms, injuries or falls, numbness in arms or legs, red hot or tender joint.

## 2018-08-24 ENCOUNTER — Other Ambulatory Visit: Payer: Self-pay | Admitting: Otolaryngology

## 2018-08-24 DIAGNOSIS — R591 Generalized enlarged lymph nodes: Secondary | ICD-10-CM

## 2018-10-16 ENCOUNTER — Encounter: Payer: Self-pay | Admitting: Diagnostic Neuroimaging

## 2018-10-16 ENCOUNTER — Other Ambulatory Visit: Payer: Self-pay | Admitting: *Deleted

## 2018-10-17 ENCOUNTER — Ambulatory Visit: Payer: BC Managed Care – PPO | Admitting: Diagnostic Neuroimaging

## 2018-10-17 ENCOUNTER — Encounter: Payer: Self-pay | Admitting: Diagnostic Neuroimaging

## 2018-10-17 ENCOUNTER — Telehealth: Payer: Self-pay | Admitting: Diagnostic Neuroimaging

## 2018-10-17 ENCOUNTER — Other Ambulatory Visit: Payer: Self-pay | Admitting: Neurosurgery

## 2018-10-17 ENCOUNTER — Other Ambulatory Visit: Payer: Self-pay

## 2018-10-17 VITALS — BP 137/82 | HR 79 | Temp 98.4°F | Ht 67.0 in | Wt 256.0 lb

## 2018-10-17 DIAGNOSIS — R200 Anesthesia of skin: Secondary | ICD-10-CM | POA: Diagnosis not present

## 2018-10-17 DIAGNOSIS — R202 Paresthesia of skin: Secondary | ICD-10-CM | POA: Diagnosis not present

## 2018-10-17 DIAGNOSIS — G450 Vertebro-basilar artery syndrome: Secondary | ICD-10-CM

## 2018-10-17 NOTE — Patient Instructions (Signed)
Thank you for coming to see [place] at [facility]. I hope we have been able to provide you high quality care today.  You may receive a patient satisfaction survey over the next few weeks. We would appreciate your feedback and comments so that we may continue to improve ourselves and the health of our patients.  - check labs and MRI   ~~~~~~~~~~~~~~~~~~~~~~~~~~~~~~~~~~~~~~~~~~~~~~~~~~~~~~~~~~~~~~~~~  DR. Miosotis Wetsel'S GUIDE TO HAPPY AND HEALTHY LIVING These are some of my general health and wellness recommendations. Some of them may apply to you better than others. Please use common sense as you try these suggestions and feel free to ask me any questions.   ACTIVITY/FITNESS Mental, social, emotional and physical stimulation are very important for brain and body health. Try learning a new activity (arts, music, language, sports, games).  Keep moving your body to the best of your abilities. You can do this at home, inside or outside, the park, community center, gym or anywhere you like. Consider a physical therapist or personal trainer to get started. Fitness trackers, smart-watches or  smart-phones can help as well.   NUTRITION Eat more plants: colorful vegetables, nuts, seeds and berries.  Eat less sugar, salt, preservatives and processed foods.  Avoid toxins such as cigarettes and alcohol.  Drink water when you are thirsty. Warm water with a slice of lemon is an excellent morning drink to start the day.  Consider these websites for more information The Nutrition Source (https://www.henry-hernandez.biz/) Precision Nutrition (WindowBlog.ch)   RELAXATION Consider practicing mindfulness meditation or other relaxation techniques such as deep breathing, prayer, yoga, tai chi, massage. See website mindful.org or the apps Headspace or Calm to help get started.   SLEEP Try to get at least 7-8+ hours sleep per day. Regular exercise  and reduced caffeine will help you sleep better. Practice good sleep hygeine techniques. See website sleep.org for more information.   PLANNING Prepare estate planning, living will, healthcare POA documents. Sometimes this is best planned with the help of an attorney. Theconversationproject.org and agingwithdignity.org are excellent resources.

## 2018-10-17 NOTE — Progress Notes (Signed)
[facility]  PATIENT: Taylor Gillespie DOB: [birth year]  REFERRING CLINICIAN: Ramos HISTORY FROM: patient and sister  REASON FOR VISIT: new consult    HISTORICAL  CHIEF COMPLAINT:  Chief Complaint  Patient presents with  . Dizziness    rm 7 New Pt, sister- Britta MccreedyBarbara, "dizzy, spinal stenosis in neck, ear pain throat hurts; tingling/pins/needles in R hand ring finger, pinky finger; shoulders ache; feet get hot; neck aches"    HISTORY OF PRESENT ILLNESS:   53 year old female here for evaluation of dizziness, tingling sensation.   March 2020 patient had onset of gait and balance difficulty.  When she would get out of her car or stand up she would feel "dizzy".  She describes a off balance, lightheaded, confusion sensation.  Symptoms can occur if she moves too quickly, stands up quickly, walks.  She also has significant neck pain, shoulder pain, hip pain.  Patient has significant insomnia, depression, fatigue.  She feels swelling and aching sensation in her neck.  No vertigo, spinning sensation, nausea or vomiting.  Patient has "tingling sensations" in her fingers and feet.  Patient has history of lumbar spine surgery.  Patient has arthritis changes in her hands, hips, knees and shoulders.  Patient has seen pain management clinic, neurosurgery clinic, ENT.  MRI cervical spine shows mild spinal stenosis and multilevel foraminal stenosis.    REVIEW OF SYSTEMS: Full 14 system review of systems performed and negative with exception of: Weight gain fatigue ringing in ears snoring insomnia dizziness confusion depression not no sleep aching muscles smoking.  ALLERGIES: Allergies  Allergen Reactions  . Morphine Nausea And Vomiting  . Lisinopril Cough  . Shellfish Allergy Rash    Sea food allergy. Sea food allergy.    HOME MEDICATIONS: Outpatient Medications Prior to Visit  Medication Sig Dispense Refill  . cholecalciferol (VITAMIN D) 1000 UNITS tablet Take  1,000 Units by mouth daily. Take  2 tab daily    . Cyanocobalamin 500 MCG CHEW Chew by mouth.    Marland Kitchen. FLUoxetine (PROZAC) 20 MG capsule     . ibuprofen (ADVIL,MOTRIN) 200 MG tablet Take 200 mg by mouth every 6 (six) hours as needed.    Marland Kitchen. losartan (COZAAR) 50 MG tablet 25 mg daily.    . Magnesium 500 MG CAPS Take by mouth.    . naproxen (NAPROSYN) 500 MG tablet Take 500 mg by mouth 2 (two) times daily with a meal.    . pantoprazole (PROTONIX) 40 MG tablet TAKE 1 TABLET (40 MG TOTAL) BY MOUTH DAILY. 30 tablet 5  . Potassium 99 MG TABS Take 99 mg by mouth daily.    Marland Kitchen. UNABLE TO FIND Med Name: tumeric 1500 mg every two days    . calcium citrate-vitamin D (CITRACAL+D) 315-200 MG-UNIT per tablet Take 1 tablet by mouth 2 (two) times daily.    Marland Kitchen. lisinopril-hydrochlorothiazide (PRINZIDE,ZESTORETIC) 20-25 MG tablet     . VIMOVO 500-20 MG TBEC      No facility-administered medications prior to visit.     PAST MEDICAL HISTORY: Past Medical History:  Diagnosis Date  . Arthritis   . Depression   . Dizziness   . Dizziness and giddiness   . Endometriosis   . Hypertension   . Osteoarthritis   . Spinal stenosis, cervical region    C5-6, C6-7    PAST SURGICAL HISTORY: Past Surgical History:  Procedure Laterality Date  . BACK SURGERY  2004  . TOTAL ABDOMINAL HYSTERECTOMY  2003    FAMILY  HISTORY: Family History  Problem Relation Age of Onset  . Colon cancer Father        deceased  . Diabetes Mellitus II Father   . Hypertension Father   . Diabetes Mother        deceased  . Arthritis Mother   . Hypertension Mother   . Hypertension Brother   . Hypertension Sister        two sisters  . Breast cancer Maternal Grandmother     SOCIAL HISTORY: Social History   Socioeconomic History  . Marital status: Married    Spouse name: Not on file  . Number of children: 1  . Years of education: 49  . Highest education level: Not on file  Occupational History    Comment: na  Social Needs  .  Financial resource strain: Not on file  . Food insecurity    Worry: Not on file    Inability: Not on file  . Transportation needs    Medical: Not on file    Non-medical: Not on file  Tobacco Use  . Smoking status: Current Every Day Smoker    Packs/day: 0.25    Years: 30.00    Pack years: 7.50    Types: Cigarettes  . Smokeless tobacco: Never Used  . Tobacco comment: 10/17/18 1 pk in 3 days  Substance and Sexual Activity  . Alcohol use: Yes    Alcohol/week: 0.0 standard drinks    Comment: occas  . Drug use: No  . Sexual activity: Not on file  Lifestyle  . Physical activity    Days per week: Not on file    Minutes per session: Not on file  . Stress: Not on file  Relationships  . Social Herbalist on phone: Not on file    Gets together: Not on file    Attends religious service: Not on file    Active member of club or organization: Not on file    Attends meetings of clubs or organizations: Not on file    Relationship status: Not on file  . Intimate partner violence    Fear of current or ex partner: Not on file    Emotionally abused: Not on file    Physically abused: Not on file    Forced sexual activity: Not on file  Other Topics Concern  . Not on file  Social History Narrative   Lives with spouse   Caffeine- coffee 3 cups daily     PHYSICAL EXAM  GENERAL EXAM/CONSTITUTIONAL: Vitals:  Vitals:   10/17/18 0839  BP: 137/82  Pulse: 79  Temp: 98.4 F (36.9 C)  Weight: 256 lb (116.1 kg)  Height: 5\' 7"  (1.702 m)     Body mass index is 40.1 kg/m. Wt Readings from Last 3 Encounters:  10/17/18 256 lb (116.1 kg)  11/11/15 239 lb (108.4 kg)  04/25/14 260 lb (117.9 kg)     Patient is in no distress; well developed, nourished and groomed  TIRED APPEARING  CARDIOVASCULAR:  Examination of carotid arteries is normal; no carotid bruits  Regular rate and rhythm, no murmurs  Examination of peripheral vascular system by observation and palpation is  normal  EYES:  Ophthalmoscopic exam of optic discs and posterior segments is normal; no papilledema or hemorrhages  No exam data present  MUSCULOSKELETAL:  Gait, strength, tone, movements noted in Neurologic exam below  NEUROLOGIC: MENTAL STATUS:  No flowsheet data found.  awake, alert, oriented to person, place and time  recent and remote memory intact  normal attention and concentration  language fluent, comprehension intact, naming intact  fund of knowledge appropriate  CRANIAL NERVE:   2nd - no papilledema on fundoscopic exam  2nd, 3rd, 4th, 6th - pupils equal and reactive to light, visual fields full to confrontation, extraocular muscles intact, no nystagmus  5th - facial sensation symmetric  7th - facial strength symmetric  8th - hearing intact  9th - palate elevates symmetrically, uvula midline  11th - shoulder shrug symmetric  12th - tongue protrusion midline  MOTOR:   normal bulk and tone, full strength in the BUE, BLE  SENSORY:   normal and symmetric to light touch, pinprick, temperature, vibration  COORDINATION:   finger-nose-finger, fine finger movements normal  REFLEXES:   deep tendon reflexes present and symmetric  GAIT/STATION:   narrow based gait; able to walk on toes, heels and tandem; romberg is negative     DIAGNOSTIC DATA (LABS, IMAGING, TESTING) - I reviewed patient records, labs, notes, testing and imaging myself where available.  No results found for: WBC, HGB, HCT, MCV, PLT No results found for: NA, K, CL, CO2, GLUCOSE, BUN, CREATININE, CALCIUM, PROT, ALBUMIN, AST, ALT, ALKPHOS, BILITOT, GFRNONAA, GFRAA No results found for: CHOL, HDL, LDLCALC, LDLDIRECT, TRIG, CHOLHDL No results found for: [id number] No results found for: VITAMINB12 No results found for: TSH   08/28/18 MRI cervical spine [I reviewed images myself and agree with interpretation. -VRP]  -Mild spinal stenosis at C5-6 and C6-7; foraminal stenosis at C5-6  and C6-7     ASSESSMENT AND PLAN  53 y.o. year old female here with constellation of symptoms including lightheadedness, gait difficulty, confusion, depression, insomnia, chronic pain.  History notable for smoking, weight gain, decreased activity, deconditioning.  No single unifying cause at this time.  Most likely presents combination of factors.  Dx:  1. 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 MR BRAIN W WO CONTRAST  . CBC with Differential/Platelet  . Comprehensive metabolic panel  . Vitamin B12  . Hemoglobin A1c  . TSH  . ANA,IFA RA Diag Pnl w/rflx Tit/Patn   Return pending test, for pending if symptoms worsen or fail to improve.    Suanne MarkerVIKRAM R. Eljay Lave, MD 10/17/2018, 9:07 AM Certified in Neurology, Neurophysiology and Neuroimaging  [facility] [address], [address] [place], [place] [phone]

## 2018-10-17 NOTE — Telephone Encounter (Signed)
Patient has [facility] faxed the order to them they will reach out to the patient to schedule.

## 2018-10-19 LAB — COMPREHENSIVE METABOLIC PANEL
ALT: 39 IU/L — ABNORMAL HIGH (ref 0–32)
AST: 28 IU/L (ref 0–40)
Albumin/Globulin Ratio: 1.8 (ref 1.2–2.2)
Albumin: 4.7 g/dL (ref 3.8–4.9)
Alkaline Phosphatase: 87 IU/L (ref 39–117)
BUN/Creatinine Ratio: 13 (ref 9–23)
BUN: 11 mg/dL (ref 6–24)
Bilirubin Total: 0.3 mg/dL (ref 0.0–1.2)
CO2: 25 mmol/L (ref 20–29)
Calcium: 9.6 mg/dL (ref 8.7–10.2)
Chloride: 99 mmol/L (ref 96–106)
Creatinine, Ser: 0.85 mg/dL (ref 0.57–1.00)
GFR calc Af Amer: 92 mL/min/{1.73_m2} (ref >59)
GFR calc non Af Amer: 80 mL/min/{1.73_m2} (ref >59)
Globulin, Total: 2.6 g/dL (ref 1.5–4.5)
Glucose: 104 mg/dL — ABNORMAL HIGH (ref 65–99)
Potassium: 4.5 mmol/L (ref 3.5–5.2)
Sodium: 139 mmol/L (ref 134–144)
Total Protein: 7.3 g/dL (ref 6.0–8.5)

## 2018-10-19 LAB — CBC WITH DIFFERENTIAL/PLATELET
Basophils Absolute: 0.1 10*3/uL (ref 0.0–0.2)
Basos: 1 %
EOS (ABSOLUTE): 0.2 10*3/uL (ref 0.0–0.4)
Eos: 2 %
Hematocrit: 44.2 % (ref 34.0–46.6)
Hemoglobin: 14.8 g/dL (ref 11.1–15.9)
Immature Grans (Abs): 0 10*3/uL (ref 0.0–0.1)
Immature Granulocytes: 0 %
Lymphocytes Absolute: 3.4 10*3/uL — ABNORMAL HIGH (ref 0.7–3.1)
Lymphs: 37 %
MCH: 29.3 pg (ref 26.6–33.0)
MCHC: 33.5 g/dL (ref 31.5–35.7)
MCV: 88 fL (ref 79–97)
Monocytes Absolute: 0.4 10*3/uL (ref 0.1–0.9)
Monocytes: 5 %
Neutrophils Absolute: 5.1 10*3/uL (ref 1.4–7.0)
Neutrophils: 55 %
Platelets: 314 10*3/uL (ref 150–450)
RBC: 5.05 x10E6/uL (ref 3.77–5.28)
RDW: 14.5 % (ref 11.7–15.4)
WBC: 9.1 10*3/uL (ref 3.4–10.8)

## 2018-10-19 LAB — ANA,IFA RA DIAG PNL W/RFLX TIT/PATN
ANA Titer 1: NEGATIVE
Cyclic Citrullin Peptide Ab: 22 units — ABNORMAL HIGH (ref 0–19)
Rhuematoid fact SerPl-aCnc: <10 IU/mL (ref 0.0–13.9)

## 2018-10-19 LAB — VITAMIN B12: Vitamin B-12: 640 pg/mL (ref 232–1245)

## 2018-10-19 LAB — HEMOGLOBIN A1C
Est. average glucose Bld gHb Est-mCnc: 120 mg/dL
Hgb A1c MFr Bld: 5.8 % — ABNORMAL HIGH (ref 4.8–5.6)

## 2018-10-19 LAB — TSH: TSH: 2.64 u[IU]/mL (ref 0.450–4.500)

## 2018-10-23 ENCOUNTER — Telehealth: Payer: Self-pay | Admitting: *Deleted

## 2018-10-23 NOTE — Telephone Encounter (Signed)
No acute findings. -VRP

## 2018-10-23 NOTE — Telephone Encounter (Addendum)
Received call back from patient. Advised her the MRI from [facility] was okay, no acute findings. Advised her overall her  labs are ok; minor changes are borderline findings. Dr Leta Baptist recommends she continue current plan.  I reviewed his plan per office note. She stated she would let Dr Nelva Bush know the MRI results, verbalized understanding, appreciation.

## 2018-10-23 NOTE — Telephone Encounter (Signed)
Received MRI brain report from [facility] On Dr [facility] desk for review.

## 2018-10-23 NOTE — Telephone Encounter (Signed)
LVM requesting call back for results. 

## 2018-11-09 ENCOUNTER — Other Ambulatory Visit: Payer: Self-pay | Admitting: Neurosurgery

## 2018-11-28 ENCOUNTER — Encounter (HOSPITAL_COMMUNITY)
Admission: RE | Admit: 2018-11-28 | Discharge: 2018-11-28 | Disposition: A | Discharge status: Home / Self Care | Payer: BC Managed Care – PPO | Source: Ambulatory Visit | Attending: Neurosurgery | Admitting: Neurosurgery

## 2018-11-28 ENCOUNTER — Other Ambulatory Visit: Payer: Self-pay

## 2018-11-28 ENCOUNTER — Other Ambulatory Visit: Payer: Self-pay | Admitting: Neurosurgery

## 2018-11-28 ENCOUNTER — Encounter (HOSPITAL_COMMUNITY): Payer: Self-pay

## 2018-11-28 DIAGNOSIS — Z01818 Encounter for other preprocedural examination: Secondary | ICD-10-CM | POA: Insufficient documentation

## 2018-11-28 HISTORY — DX: Presence of dental prosthetic device (complete) (partial): Z97.2

## 2018-11-28 LAB — BASIC METABOLIC PANEL
Anion gap: 10 (ref 5–15)
BUN: 9 mg/dL (ref 6–20)
CO2: 28 mmol/L (ref 22–32)
Calcium: 9.4 mg/dL (ref 8.9–10.3)
Chloride: 104 mmol/L (ref 98–111)
Creatinine, Ser: 0.87 mg/dL (ref 0.44–1.00)
GFR calc Af Amer: >60 mL/min (ref >60)
GFR calc non Af Amer: >60 mL/min (ref >60)
Glucose, Bld: 109 mg/dL — ABNORMAL HIGH (ref 70–99)
Potassium: 4 mmol/L (ref 3.5–5.1)
Sodium: 142 mmol/L (ref 135–145)

## 2018-11-28 LAB — SURGICAL PCR SCREEN
MRSA, PCR: NEGATIVE
Staphylococcus aureus: NEGATIVE

## 2018-11-28 LAB — CBC
HCT: 45.1 % (ref 36.0–46.0)
Hemoglobin: 14.5 g/dL (ref 12.0–15.0)
MCH: 29.2 pg (ref 26.0–34.0)
MCHC: 32.2 g/dL (ref 30.0–36.0)
MCV: 90.9 fL (ref 80.0–100.0)
Platelets: 266 10*3/uL (ref 150–400)
RBC: 4.96 MIL/uL (ref 3.87–5.11)
RDW: 13.6 % (ref 11.5–15.5)
WBC: 9.2 10*3/uL (ref 4.0–10.5)
nRBC: 0 % (ref 0.0–0.2)

## 2018-11-28 LAB — TYPE AND SCREEN
ABO/RH(D): O POS
Antibody Screen: NEGATIVE

## 2018-11-28 LAB — ABO/RH: ABO/RH(D): O POS

## 2018-11-28 NOTE — Progress Notes (Signed)
Patient denies shortness of breath, fever, cough and chest pain at PAT appointment  PCP - Nicki Reaper, [place] [facility] Cardiologist - Denies  Chest x-ray - Denies EKG - 11/28/18 Stress Test - Denies ECHO - Denies Cardiac Cath - Denies  Anesthesia review: No  7 days prior to surgery STOP taking any Aspirin (unless otherwise instructed by your surgeon), Aleve, Naproxen, Ibuprofen, Motrin, Advil, Goody's, BC's, all herbal medications, fish oil, and all vitamins.   Coronavirus Screening Have you or your husband experienced the following symptoms:  Cough yes/no: No Fever (>100.73F)  yes/no: No Runny nose yes/no: No Sore throat yes/no: No Difficulty breathing/shortness of breath  yes/no: No  Have you or your husband traveled in the last 14 days and where? yes/no: No   Patient verbalized understanding of instructions that were given to them at the PAT appointment.

## 2018-11-28 NOTE — Progress Notes (Signed)
[id number] - [place], [place] - [address] [place] [address] Phone: [phone] Fax: [phone]      Your procedure is scheduled on Wednesday, 10/7.  Report to [facility] Main Entrance "A" at 6:30 A.M., and check in at the Admitting office.  Call this number if you have problems the morning of surgery:  [phone]  Call [phone] if you have any questions prior to your surgery date Monday-Friday 8am-4pm    Remember:  Do not eat or drink after midnight the night before your surgery   Take these medicines the morning of surgery with A SIP OF WATER : pantoprazole (PROTONIX)  7 days prior to surgery STOP taking any Aspirin (unless otherwise instructed by your surgeon), Aleve, Naproxen, Ibuprofen, Motrin, Advil, Goody's, BC's, all herbal medications, fish oil, and all vitamins.    The Morning of Surgery  Do not wear jewelry, make-up or nail polish.  Do not wear lotions, powders, perfumes, or deodorant  Do not shave 48 hours prior to surgery.    Do not bring valuables to the hospital.  [facility] is not responsible for any belongings or valuables.  If you are a smoker, DO NOT Smoke 24 hours prior to surgery IF you wear a CPAP at night please bring your mask, tubing, and machine the morning of surgery   Remember that you must have someone to transport you home after your surgery, and remain with you for 24 hours if you are discharged the same day.   Contacts, glasses, hearing aids, dentures or bridgework may not be worn into surgery.    Leave your suitcase in the car.  After surgery it may be brought to your room.  For patients admitted to the hospital, discharge time will be determined by your treatment team.  Patients discharged the day of surgery will not be allowed to drive home.    Special instructions:   [place]- Preparing For Surgery  Before surgery, you can play an important  role. Because skin is not sterile, your skin needs to be as free of germs as possible. You can reduce the number of germs on your skin by washing with CHG (chlorahexidine gluconate) Soap before surgery.  CHG is an antiseptic cleaner which kills germs and bonds with the skin to continue killing germs even after washing.    Oral Hygiene is also important to reduce your risk of infection.  Remember - BRUSH YOUR TEETH THE MORNING OF SURGERY WITH YOUR REGULAR TOOTHPASTE  Please do not use if you have an allergy to CHG or antibacterial soaps. If your skin becomes reddened/irritated stop using the CHG.  Do not shave (including legs and underarms) for at least 48 hours prior to first CHG shower. It is OK to shave your face.  Please follow these instructions carefully.   1. Shower the NIGHT BEFORE SURGERY and the MORNING OF SURGERY with CHG Soap.   2. If you chose to wash your hair, wash your hair first as usual with your normal shampoo.  3. After you shampoo, rinse your hair and body thoroughly to remove the shampoo.  4. Use CHG as you would any other liquid soap. You can apply CHG directly to the skin and wash gently with a scrungie or a clean washcloth.   5. Apply the CHG Soap to your body ONLY FROM THE NECK DOWN.  Do not use on open wounds or open sores. Avoid  contact with your eyes, ears, mouth and genitals (private parts). Wash Face and genitals (private parts)  with your normal soap.   6. Wash thoroughly, paying special attention to the area where your surgery will be performed.  7. Thoroughly rinse your body with warm water from the neck down.  8. DO NOT shower/wash with your normal soap after using and rinsing off the CHG Soap.  9. Pat yourself dry with a CLEAN TOWEL.  10. Wear CLEAN PAJAMAS to bed the night before surgery, wear comfortable clothes the morning of surgery  11. Place CLEAN SHEETS on your bed the night of your first shower and DO NOT SLEEP WITH PETS.    Day of  Surgery:  Do not apply any deodorants/lotions. Please shower the morning of surgery with the CHG soap  Please wear clean clothes to the hospital/surgery center.   Remember to brush your teeth WITH YOUR REGULAR TOOTHPASTE.   Please read over the following fact sheets that you were given.

## 2018-12-03 ENCOUNTER — Other Ambulatory Visit (HOSPITAL_COMMUNITY): Payer: BLUE CROSS/BLUE SHIELD

## 2018-12-05 ENCOUNTER — Encounter (HOSPITAL_COMMUNITY): Admission: RE | Payer: Self-pay | Source: Home / Self Care

## 2018-12-05 ENCOUNTER — Inpatient Hospital Stay (HOSPITAL_COMMUNITY): Admission: RE | Admit: 2018-12-05 | Payer: BC Managed Care – PPO | Source: Home / Self Care | Admitting: Neurosurgery

## 2018-12-05 SURGERY — ANTERIOR CERVICAL DECOMPRESSION/DISCECTOMY FUSION 3 LEVELS
Anesthesia: General

## 2019-05-06 ENCOUNTER — Other Ambulatory Visit: Payer: Self-pay | Admitting: Otolaryngology

## 2019-05-06 DIAGNOSIS — R591 Generalized enlarged lymph nodes: Secondary | ICD-10-CM

## 2019-05-14 ENCOUNTER — Other Ambulatory Visit: Payer: Self-pay | Admitting: Obstetrics and Gynecology

## 2019-05-14 DIAGNOSIS — Z1231 Encounter for screening mammogram for malignant neoplasm of breast: Secondary | ICD-10-CM

## 2019-05-23 ENCOUNTER — Ambulatory Visit
Admission: RE | Admit: 2019-05-23 | Discharge: 2019-05-23 | Disposition: A | Discharge status: Home / Self Care | Payer: BC Managed Care – PPO | Source: Ambulatory Visit | Attending: Obstetrics and Gynecology | Admitting: Obstetrics and Gynecology

## 2019-05-23 ENCOUNTER — Other Ambulatory Visit: Payer: Self-pay

## 2019-05-23 DIAGNOSIS — Z1231 Encounter for screening mammogram for malignant neoplasm of breast: Secondary | ICD-10-CM

## 2020-02-07 ENCOUNTER — Other Ambulatory Visit: Payer: Self-pay | Admitting: Neurosurgery

## 2020-02-07 DIAGNOSIS — M5440 Lumbago with sciatica, unspecified side: Secondary | ICD-10-CM

## 2020-03-11 ENCOUNTER — Ambulatory Visit
Admission: RE | Admit: 2020-03-11 | Discharge: 2020-03-11 | Disposition: A | Discharge status: Home / Self Care | Payer: BC Managed Care – PPO | Source: Ambulatory Visit | Attending: Neurosurgery | Admitting: Neurosurgery

## 2020-03-11 DIAGNOSIS — M5440 Lumbago with sciatica, unspecified side: Secondary | ICD-10-CM

## 2020-04-27 ENCOUNTER — Other Ambulatory Visit: Payer: Self-pay

## 2020-04-27 ENCOUNTER — Emergency Department (HOSPITAL_COMMUNITY): Payer: BC Managed Care – PPO

## 2020-04-27 ENCOUNTER — Emergency Department (HOSPITAL_COMMUNITY)
Admission: EM | Admit: 2020-04-27 | Discharge: 2020-04-27 | Disposition: A | Discharge status: Home / Self Care | Payer: BC Managed Care – PPO | Source: Home / Self Care | Attending: Emergency Medicine | Admitting: Emergency Medicine

## 2020-04-27 ENCOUNTER — Encounter (HOSPITAL_COMMUNITY): Payer: Self-pay

## 2020-04-27 DIAGNOSIS — M25352 Other instability, left hip: Secondary | ICD-10-CM | POA: Diagnosis not present

## 2020-04-27 DIAGNOSIS — Z96642 Presence of left artificial hip joint: Secondary | ICD-10-CM | POA: Insufficient documentation

## 2020-04-27 DIAGNOSIS — Z79899 Other long term (current) drug therapy: Secondary | ICD-10-CM | POA: Insufficient documentation

## 2020-04-27 DIAGNOSIS — S73005A Unspecified dislocation of left hip, initial encounter: Secondary | ICD-10-CM

## 2020-04-27 DIAGNOSIS — X501XXA Overexertion from prolonged static or awkward postures, initial encounter: Secondary | ICD-10-CM | POA: Insufficient documentation

## 2020-04-27 DIAGNOSIS — I10 Essential (primary) hypertension: Secondary | ICD-10-CM | POA: Insufficient documentation

## 2020-04-27 DIAGNOSIS — T84021A Dislocation of internal left hip prosthesis, initial encounter: Secondary | ICD-10-CM | POA: Diagnosis not present

## 2020-04-27 DIAGNOSIS — Z87891 Personal history of nicotine dependence: Secondary | ICD-10-CM | POA: Insufficient documentation

## 2020-04-27 LAB — CBC WITH DIFFERENTIAL/PLATELET
Abs Immature Granulocytes: 0.04 10*3/uL (ref 0.00–0.07)
Basophils Absolute: 0 10*3/uL (ref 0.0–0.1)
Basophils Relative: 0 %
Eosinophils Absolute: 0.1 10*3/uL (ref 0.0–0.5)
Eosinophils Relative: 1 %
HCT: 38.6 % (ref 36.0–46.0)
Hemoglobin: 12.3 g/dL (ref 12.0–15.0)
Immature Granulocytes: 0 %
Lymphocytes Relative: 36 %
Lymphs Abs: 3.8 10*3/uL (ref 0.7–4.0)
MCH: 28.1 pg (ref 26.0–34.0)
MCHC: 31.9 g/dL (ref 30.0–36.0)
MCV: 88.3 fL (ref 80.0–100.0)
Monocytes Absolute: 0.6 10*3/uL (ref 0.1–1.0)
Monocytes Relative: 6 %
Neutro Abs: 6.1 10*3/uL (ref 1.7–7.7)
Neutrophils Relative %: 57 %
Platelets: 335 10*3/uL (ref 150–400)
RBC: 4.37 MIL/uL (ref 3.87–5.11)
RDW: 14.6 % (ref 11.5–15.5)
WBC: 10.6 10*3/uL — ABNORMAL HIGH (ref 4.0–10.5)
nRBC: 0 % (ref 0.0–0.2)

## 2020-04-27 LAB — BASIC METABOLIC PANEL
Anion gap: 10 (ref 5–15)
BUN: 10 mg/dL (ref 6–20)
CO2: 30 mmol/L (ref 22–32)
Calcium: 9.6 mg/dL (ref 8.9–10.3)
Chloride: 100 mmol/L (ref 98–111)
Creatinine, Ser: 0.79 mg/dL (ref 0.44–1.00)
GFR, Estimated: >60 mL/min (ref >60)
Glucose, Bld: 105 mg/dL — ABNORMAL HIGH (ref 70–99)
Potassium: 2.8 mmol/L — ABNORMAL LOW (ref 3.5–5.1)
Sodium: 140 mmol/L (ref 135–145)

## 2020-04-27 MED ORDER — HYDROMORPHONE HCL 1 MG/ML IJ SOLN
0.5000 mg | Freq: Once | INTRAMUSCULAR | Status: AC
  Administered 2020-04-27: 0.5 mg via INTRAVENOUS
  Filled 2020-04-27: qty 1

## 2020-04-27 MED ORDER — PROPOFOL 10 MG/ML IV BOLUS
0.5000 mg/kg | Freq: Once | INTRAVENOUS | Status: DC
  Filled 2020-04-27: qty 20

## 2020-04-27 MED ORDER — FENTANYL CITRATE (PF) 100 MCG/2ML IJ SOLN
100.0000 ug | Freq: Once | INTRAMUSCULAR | Status: AC
  Administered 2020-04-27: 100 ug via INTRAVENOUS
  Filled 2020-04-27: qty 2

## 2020-04-27 MED ORDER — ONDANSETRON HCL 4 MG/2ML IJ SOLN
4.0000 mg | Freq: Once | INTRAMUSCULAR | Status: AC
  Administered 2020-04-27: 4 mg via INTRAVENOUS
  Filled 2020-04-27: qty 2

## 2020-04-27 MED ORDER — FENTANYL CITRATE (PF) 100 MCG/2ML IJ SOLN
INTRAMUSCULAR | Status: AC | PRN
  Administered 2020-04-27: 100 ug via INTRAVENOUS

## 2020-04-27 MED ORDER — PROPOFOL 10 MG/ML IV BOLUS
INTRAVENOUS | Status: AC | PRN
  Administered 2020-04-27: 113.9 mg via INTRAVENOUS

## 2020-04-27 NOTE — ED Notes (Signed)
Pt consent for procedural sedation and L hip reduction at bedside

## 2020-04-27 NOTE — ED Triage Notes (Signed)
Pt arrived via [facility] from home. 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 Pt states she was sitting on the couch and moved her lower leg adducted and she heard a pop in her hip that had a cramping pain in her left leg. Pt's left leg is rotated outward. Pt is A&Ox4. Pt has 2+ left pedal pulse, sensation intact, warm to touch, pt able to wiggle toes.

## 2020-04-27 NOTE — ED Notes (Signed)
Patient verbalizes understanding of discharge instructions. Opportunity for questioning and answers were provided. Armband removed by staff, pt discharged from ED via wheelchair.  

## 2020-04-27 NOTE — ED Notes (Signed)
Consent put in medical records

## 2020-04-27 NOTE — ED Notes (Signed)
This RN called the ortho tech to come place the left knee immobilizer

## 2020-04-27 NOTE — ED Provider Notes (Signed)
[facility] EMERGENCY DEPARTMENT Provider Note   CSN: [id number] Arrival date & time: 04/27/20  1814     History Chief Complaint  Patient presents with  . Hip Injury    Taylor Gillespie is a 55 y.o. female.  55 yo F with a chief complaint of left hip pain.  Patient had a recent hip replacement done by Dr. Charlann Boxer this was about 3 weeks ago.  She was trying to itch her right leg with her left foot and she felt a pop and had some severe pain.  She denied overt trauma.  Not able to bear weight or move it.  The history is provided by the patient.  Injury This is a new problem. The current episode started less than 1 hour ago. The problem occurs constantly. The problem has not changed since onset.Pertinent negatives include no chest pain, no headaches and no shortness of breath. The symptoms are aggravated by bending and twisting. Nothing relieves the symptoms. She has tried nothing for the symptoms. The treatment provided no relief.       Past Medical History:  Diagnosis Date  . Arthritis   . Depression   . Dizziness   . Dizziness and giddiness   . Endometriosis   . Hypertension   . Osteoarthritis   . 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 Spinal stenosis, cervical region    C5-6, C6-7    Patient Active Problem List   Diagnosis Date Noted  . HTN (hypertension) 04/25/2014  . Depression 04/25/2014  . Chronic back pain 04/25/2014    Past Surgical History:  Procedure Laterality Date  . BACK SURGERY  2004  . COLONOSCOPY  05/2014   gessner, repeat 5 yrs, [facility]  . JOINT REPLACEMENT    . TOTAL ABDOMINAL HYSTERECTOMY  2003  . UPPER GI ENDOSCOPY  05/2014   [name] History   No obstetric history on file.     Family History  Problem Relation Age of Onset  . Colon cancer Father        deceased  . Diabetes Mellitus II Father   . Hypertension Father   . Diabetes  Mother        deceased  . Arthritis Mother   . Hypertension Mother   . Hypertension Brother   . Hypertension Sister        two sisters  . Breast cancer Maternal Grandmother     Social History   Tobacco Use  . Smoking status: Former Smoker    Packs/day: 0.50    Years: 30.00    Pack years: 15.00    Types: Cigarettes  . Smokeless tobacco: Never Used  Vaping Use  . Vaping Use: Never used  Substance Use Topics  . Alcohol use: Yes    Alcohol/week: 0.0 standard drinks    Comment: Occasional Beer/Wine  . Drug use: No    Home Medications Prior to Admission medications   Medication Sig Start Date End Date Taking? Authorizing Provider  cholecalciferol (VITAMIN D) 1000 UNITS tablet Take 3,000 Units by mouth daily after lunch.     [provider]  FLUoxetine (PROZAC) 20 MG capsule Take 20 mg by mouth daily after lunch.  11/10/15   [provider]  ibuprofen (ADVIL,MOTRIN) 200 MG tablet Take 400 mg by mouth 2 (two) times daily as needed (pain.).     [provider]  losartan (COZAAR) 50 MG tablet Take 50 mg by mouth daily after lunch.     [provider]  Magnesium 500 MG CAPS Take 500 mg by mouth once a week.     [provider]  naproxen (NAPROSYN) 500 MG tablet Take 500 mg by mouth 2 (two) times daily as needed (pain/inflammation.).     [provider]  pantoprazole (PROTONIX) 40 MG tablet TAKE 1 TABLET (40 MG TOTAL) BY MOUTH DAILY. Patient taking differently: Take 40 mg by mouth daily after lunch.  06/01/15   Iva Boop, MD  Potassium 99 MG TABS Take 198 mg by mouth every 3 (three) days.     [provider]  TURMERIC CURCUMIN PO Take 1,950 mg by mouth 2 (two) times a week.    [provider]  vitamin B-12 (CYANOCOBALAMIN) 500 MCG tablet Take 500 mcg by mouth once a week.    [provider]    Allergies    Diclofenac, Ferrous sulfate, Morphine, Lisinopril, and Shellfish allergy  Review of Systems    Review of Systems  Constitutional: Negative for chills and fever.  HENT: Negative for congestion and rhinorrhea.   Eyes: Negative for redness and visual disturbance.  Respiratory: Negative for shortness of breath and wheezing.   Cardiovascular: Negative for chest pain and palpitations.  Gastrointestinal: Negative for nausea and vomiting.  Genitourinary: Negative for dysuria and urgency.  Musculoskeletal: Positive for arthralgias and myalgias.  Skin: Negative for pallor and wound.  Neurological: Negative for dizziness and headaches.    Physical Exam Updated Vital Signs BP 123/72   Pulse 74   Temp 98.2 F (36.8 C) (Oral)   Resp 18   Ht  (1.702 m)   Wt 113.9 kg   LMP  (LMP Unknown)   SpO2 97%   BMI 39.31 kg/m   Physical Exam Vitals and nursing note reviewed.  Constitutional:      General: She is not in acute distress.    Appearance: She is well-developed and well-nourished. She is not diaphoretic.  HENT:     Head: Normocephalic and atraumatic.  Eyes:     Extraocular Movements: EOM normal.     Pupils: Pupils are equal, round, and reactive to light.  Cardiovascular:     Rate and Rhythm: Normal rate and regular rhythm.     Heart sounds: No murmur heard. No friction rub. No gallop.   Pulmonary:     Effort: Pulmonary effort is normal.     Breath sounds: No wheezing or rales.  Abdominal:     General: There is no distension.     Palpations: Abdomen is soft.     Tenderness: There is no abdominal tenderness.  Musculoskeletal:        General: Tenderness present. No edema.     Cervical back: Normal range of motion and neck supple.     Comments: Left lower extremity is shorter than the right.  Pain along the proximal femur.  Pulse motor and sensation are intact distally.  Skin:    General: Skin is warm and dry.  Neurological:     Mental Status: She is alert and oriented to person, place, and time.  Psychiatric:        Mood and Affect: Mood and affect normal.         Behavior: Behavior normal.     ED Results / Procedures / Treatments   Labs (all labs ordered are listed, but only abnormal results are displayed)  Labs Reviewed  CBC WITH DIFFERENTIAL/PLATELET - Abnormal; Notable for the following components:      Result Value   WBC 10.6 (*)    All other components within normal limits  BASIC METABOLIC PANEL - Abnormal; Notable for the following components:   Potassium 2.8 (*)    Glucose, Bld 105 (*)    All other components within normal limits    EKG None  Radiology DG Hip Unilat W or Wo Pelvis 2-3 Views Left  Result Date: 04/27/2020 CLINICAL DATA:  Postreduction of LEFT hip arthroplasty. EXAM: DG HIP (WITH OR WITHOUT PELVIS) 2-3V LEFT COMPARISON:  04/27/2020 FINDINGS: Patient has undergone interval reduction of LEFT hip arthroplasty. The femoral head component is located within the acetabular component. Remote lumbar fusion. IMPRESSION: Successful reduction of LEFT hip arthroplasty. Electronically Signed   By: Norva PavlovElizabeth  Brown M.D.   On: 04/27/2020 21:35   DG Hip Unilat W or Wo Pelvis 2-3 Views Left  Result Date: 04/27/2020 CLINICAL DATA:  Left hip dislocation, bilateral hip arthroplasties EXAM: DG HIP (WITH OR WITHOUT PELVIS) 2-3V LEFT COMPARISON:  None. FINDINGS: Frontal view of the pelvis as well as frontal and frogleg lateral views of the left hip are obtained. There is superolateral dislocation of the femoral component of the left hip arthroplasty. I do not see any acute displaced fractures. The right hip prosthesis remains well aligned. No acute bony abnormalities. Postsurgical changes at the lumbosacral junction. IMPRESSION: 1. Superolateral dislocation of the femoral component of the left hip arthroplasty. Electronically Signed   By: Sharlet SalinaMichael  Brown M.D.   On: 04/27/2020 19:14    Procedures .Sedation  Date/Time: 04/27/2020 11:17 PM Performed by: Melene PlanFloyd, Sianni Cloninger, DO Authorized by: Melene PlanFloyd, Moss Berry, DO   Consent:    Consent obtained:  Verbal    Consent given by:  Patient   Risks discussed:  Allergic reaction, dysrhythmia, inadequate sedation, nausea, prolonged hypoxia resulting in organ damage, prolonged sedation necessitating reversal, respiratory compromise necessitating ventilatory assistance and intubation and vomiting   Alternatives discussed:  Analgesia without sedation, anxiolysis and regional anesthesia Universal protocol:    Procedure explained and questions answered to patient or proxy's satisfaction: yes     Relevant documents present and verified: yes     Test results available: yes     Imaging studies available: yes     Required blood products, implants, devices, and special equipment available: yes     Site/side marked: yes     Immediately prior to procedure, a time out was called: yes     Patient identity confirmed:  Verbally with patient Indications:    Procedure performed:  Dislocation reduction   Procedure necessitating sedation performed by:  Different physician Pre-sedation assessment:    Time since last food or drink:  6   ASA classification: class 2 - patient with mild systemic disease     Mouth opening:  3 or more finger widths   Thyromental distance:  4 finger widths   Mallampati score:  II - soft palate, uvula, fauces visible   Neck mobility: normal     Pre-sedation assessments completed and reviewed: airway patency, cardiovascular function, hydration status, mental status, nausea/vomiting, pain level, respiratory function and temperature   Immediate pre-procedure details:    Reassessment: Patient reassessed immediately prior to procedure     Reviewed: vital signs, relevant labs/tests and NPO status     Verified: bag valve mask available, emergency equipment available, intubation equipment available, IV patency confirmed, oxygen available and suction available   Procedure details (  see MAR for exact dosages):    Preoxygenation:  Nasal cannula   Sedation:  Propofol   Intended level of sedation: deep    Analgesia:  Fentanyl   Intra-procedure monitoring:  Blood pressure monitoring, cardiac monitor, continuous pulse oximetry, frequent LOC assessments, frequent vital sign checks and continuous capnometry   Intra-procedure events: respiratory depression     Intra-procedure management:  Airway repositioning, BVM ventilation and supplemental oxygen   Total Provider sedation time (minutes):  35 Post-procedure details:    Attendance: Constant attendance by certified staff until patient recovered     Recovery: Patient returned to pre-procedure baseline     Post-sedation assessments completed and reviewed: airway patency, cardiovascular function, hydration status, mental status, nausea/vomiting, pain level, respiratory function and temperature     Patient is stable for discharge or admission: yes     Procedure completion:  Tolerated well, no immediate complications  SPLINT APPLICATION Date/Time: 11:18 PM Authorized by: Rae Roam Consent: Verbal consent obtained. Risks and benefits: risks, benefits and alternatives were discussed Consent given by: patient Splint applied by: orthopedic technician Location details: L leg Splint type: knee immobilizer Supplies used: knee immobilizer Post-procedure: The splinted body part was neurovascularly unchanged following the procedure. Patient tolerance: Patient tolerated the procedure well with no immediate complications.        Medications Ordered in ED Medications  propofol (DIPRIVAN) 10 mg/mL bolus/IV push 57 mg (57 mg Intravenous See Procedure Record 04/27/20 2109)  HYDROmorphone (DILAUDID) injection 0.5 mg (0.5 mg Intravenous Given 04/27/20 1848)  ondansetron (ZOFRAN) injection 4 mg (4 mg Intravenous Given 04/27/20 1848)  fentaNYL (SUBLIMAZE) injection 100 mcg (100 mcg Intravenous Given 04/27/20 2055)  propofol (DIPRIVAN) 10 mg/mL bolus/IV push (113.9 mg Intravenous Given 04/27/20 2100)  fentaNYL (SUBLIMAZE) injection (100 mcg Intravenous  Given 04/27/20 2055)    ED Course  I have reviewed the triage vital signs and the nursing notes.  Pertinent labs & imaging results that were available during my care of the patient were reviewed by me and considered in my medical decision making (see chart for details).    MDM Rules/Calculators/A&P                          55 yo F with a chief complaints of left hip pain.  Clinically the patient is likely dislocated.  Plain film confirms as viewed by me.  I discussed the case with Dr. Charlann Boxer who felt it reasonable to attempt an ED sedation and reduction.  Reduction performed in the ED without significant issue.  Patient reassessed and back to baseline no significant hip pain.  Placed in a knee immobilizer.  Orthopedic follow-up.  11:18 PM:  I have discussed the diagnosis/risks/treatment options with the patient and family and believe the pt to be eligible for discharge home to follow-up with Ortho. We also discussed returning to the ED immediately if new or worsening sx occur. We discussed the sx which are most concerning (e.g., sudden worsening pain, fever, inability to tolerate by mouth) that necessitate immediate return. Medications administered to the patient during their visit and any new prescriptions provided to the patient are listed below.  Medications given during this visit Medications  propofol (DIPRIVAN) 10 mg/mL bolus/IV push 57 mg (57 mg Intravenous See Procedure Record 04/27/20 2109)  HYDROmorphone (DILAUDID) injection 0.5 mg (0.5 mg Intravenous Given 04/27/20 1848)  ondansetron (ZOFRAN) injection 4 mg (4 mg Intravenous Given 04/27/20 1848)  fentaNYL (SUBLIMAZE) injection 100 mcg (100  mcg Intravenous Given 04/27/20 2055)  propofol (DIPRIVAN) 10 mg/mL bolus/IV push (113.9 mg Intravenous Given 04/27/20 2100)  fentaNYL (SUBLIMAZE) injection (100 mcg Intravenous Given 04/27/20 2055)     The patient appears reasonably screen and/or stabilized for discharge and I doubt any other medical  condition or other [facility] requiring further screening, evaluation, or treatment in the ED at this time prior to discharge.   Final Clinical Impression(s) / ED Diagnoses Final diagnoses:  Dislocation of left hip, initial encounter [facility])    Rx / DC Orders ED Discharge Orders    None       Melene Plan, DO 04/27/20 2319

## 2020-04-27 NOTE — ED Provider Notes (Signed)
I assisted Dr. Adela Lank reduction, please see his note.  I was directly supervised by Dr. Adela Lank.  [facility] Injury Treatment  Date/Time: 04/27/2020 10:54 PM Performed by: Cristina Gong, PA-C Authorized by: Cristina Gong, PA-C   Consent:    Consent obtained:  Written   Consent given by:  Patient   Risks discussed:  Fracture, irreducible dislocation, recurrent dislocation, restricted joint movement, vascular damage, stiffness and nerve damage (Need for additional procedures, pain)   Alternatives discussed:  No treatment, alternative treatment and referralInjury location: hip Location details: left hip Injury type: dislocation (Superolateral dislocation) Spontaneous dislocation: yes Prosthesis: yes Pre-procedure neurovascular assessment: neurovascularly intact  Patient sedated: Yes. Refer to sedation procedure documentation for details of sedation. Manipulation performed: yes Reduction method: traction and counter traction Reduction successful: yes X-ray confirmed reduction: yes Immobilization: Knee immobilizer. Splint Applied by: Milon Dikes Post-procedure neurovascular assessment: post-procedure neurovascularly intact Post-procedure distal perfusion: normal Post-procedure neurological function: normal Post-procedure range of motion: normal    DG Hip Unilat W or Wo Pelvis 2-3 Views Left  Result Date: 04/27/2020 CLINICAL DATA:  Postreduction of LEFT hip arthroplasty. EXAM: DG HIP (WITH OR WITHOUT PELVIS) 2-3V LEFT COMPARISON:  04/27/2020 FINDINGS: Patient has undergone interval reduction of LEFT hip arthroplasty. The femoral head component is located within the acetabular component. Remote lumbar fusion. IMPRESSION: Successful reduction of LEFT hip arthroplasty. Electronically Signed   By: Norva Pavlov M.D.   On: 04/27/2020 21:35   DG Hip Unilat W or Wo Pelvis 2-3 Views Left  Result Date: 04/27/2020 CLINICAL DATA:  Left hip dislocation, bilateral hip arthroplasties EXAM:  DG HIP (WITH OR WITHOUT PELVIS) 2-3V LEFT COMPARISON:  None. FINDINGS: Frontal view of the pelvis as well as frontal and frogleg lateral views of the left hip are obtained. There is superolateral dislocation of the femoral component of the left hip arthroplasty. I do not see any acute displaced fractures. The right hip prosthesis remains well aligned. No acute bony abnormalities. Postsurgical changes at the lumbosacral junction. IMPRESSION: 1. Superolateral dislocation of the femoral component of the left hip arthroplasty. Electronically Signed   By: Sharlet Salina M.D.   On: 04/27/2020 19:14       Cristina Gong, PA-C 04/27/20 2301    Melene Plan, DO 04/27/20 2319

## 2020-04-27 NOTE — Discharge Instructions (Signed)
Return for repeat event.  Follow up with your ortho doc.

## 2020-04-28 NOTE — Progress Notes (Signed)
Orthopedic Tech Progress Note Patient Details:  Taylor Gillespie 17-May-1965 [id number]  Ortho Devices Type of Ortho Device: Knee Immobilizer Ortho Device/Splint Location: lle Ortho Device/Splint Interventions: Ordered,Application,Adjustment   Post Interventions Patient Tolerated: Well Instructions Provided: Care of device,Adjustment of device   Trinna Post 04/28/2020, 1:50 AM

## 2020-04-29 ENCOUNTER — Other Ambulatory Visit: Payer: Self-pay

## 2020-04-29 ENCOUNTER — Inpatient Hospital Stay (HOSPITAL_COMMUNITY)
Admission: EM | Admit: 2020-04-29 | Discharge: 2020-05-01 | DRG: 466 | Disposition: A | Discharge status: Home / Self Care | Payer: BC Managed Care – PPO | Attending: Orthopedic Surgery | Admitting: Orthopedic Surgery

## 2020-04-29 ENCOUNTER — Encounter (HOSPITAL_COMMUNITY): Payer: Self-pay

## 2020-04-29 ENCOUNTER — Emergency Department (HOSPITAL_COMMUNITY): Payer: BC Managed Care – PPO

## 2020-04-29 DIAGNOSIS — M549 Dorsalgia, unspecified: Secondary | ICD-10-CM | POA: Diagnosis present

## 2020-04-29 DIAGNOSIS — Z96649 Presence of unspecified artificial hip joint: Secondary | ICD-10-CM

## 2020-04-29 DIAGNOSIS — Z888 Allergy status to other drugs, medicaments and biological substances status: Secondary | ICD-10-CM

## 2020-04-29 DIAGNOSIS — Z8261 Family history of arthritis: Secondary | ICD-10-CM

## 2020-04-29 DIAGNOSIS — U071 COVID-19: Secondary | ICD-10-CM | POA: Diagnosis present

## 2020-04-29 DIAGNOSIS — Y792 Prosthetic and other implants, materials and accessory orthopedic devices associated with adverse incidents: Secondary | ICD-10-CM | POA: Diagnosis present

## 2020-04-29 DIAGNOSIS — Z96641 Presence of right artificial hip joint: Secondary | ICD-10-CM | POA: Diagnosis present

## 2020-04-29 DIAGNOSIS — Z90710 Acquired absence of both cervix and uterus: Secondary | ICD-10-CM

## 2020-04-29 DIAGNOSIS — F32A Depression, unspecified: Secondary | ICD-10-CM | POA: Diagnosis present

## 2020-04-29 DIAGNOSIS — Z8249 Family history of ischemic heart disease and other diseases of the circulatory system: Secondary | ICD-10-CM

## 2020-04-29 DIAGNOSIS — Z91013 Allergy to seafood: Secondary | ICD-10-CM

## 2020-04-29 DIAGNOSIS — Z885 Allergy status to narcotic agent status: Secondary | ICD-10-CM

## 2020-04-29 DIAGNOSIS — Z981 Arthrodesis status: Secondary | ICD-10-CM

## 2020-04-29 DIAGNOSIS — M25352 Other instability, left hip: Secondary | ICD-10-CM | POA: Diagnosis present

## 2020-04-29 DIAGNOSIS — S73035A Other anterior dislocation of left hip, initial encounter: Secondary | ICD-10-CM

## 2020-04-29 DIAGNOSIS — Z7982 Long term (current) use of aspirin: Secondary | ICD-10-CM

## 2020-04-29 DIAGNOSIS — Z79899 Other long term (current) drug therapy: Secondary | ICD-10-CM

## 2020-04-29 DIAGNOSIS — T84021A Dislocation of internal left hip prosthesis, initial encounter: Principal | ICD-10-CM | POA: Diagnosis present

## 2020-04-29 DIAGNOSIS — G8929 Other chronic pain: Secondary | ICD-10-CM | POA: Diagnosis present

## 2020-04-29 DIAGNOSIS — I10 Essential (primary) hypertension: Secondary | ICD-10-CM | POA: Diagnosis present

## 2020-04-29 DIAGNOSIS — Z87891 Personal history of nicotine dependence: Secondary | ICD-10-CM

## 2020-04-29 DIAGNOSIS — Z886 Allergy status to analgesic agent status: Secondary | ICD-10-CM

## 2020-04-29 DIAGNOSIS — Z419 Encounter for procedure for purposes other than remedying health state, unspecified: Secondary | ICD-10-CM

## 2020-04-29 DIAGNOSIS — M1990 Unspecified osteoarthritis, unspecified site: Secondary | ICD-10-CM | POA: Diagnosis present

## 2020-04-29 LAB — CBC WITH DIFFERENTIAL/PLATELET
Abs Immature Granulocytes: 0.03 10*3/uL (ref 0.00–0.07)
Basophils Absolute: 0 10*3/uL (ref 0.0–0.1)
Basophils Relative: 0 %
Eosinophils Absolute: 0.1 10*3/uL (ref 0.0–0.5)
Eosinophils Relative: 1 %
HCT: 35.6 % — ABNORMAL LOW (ref 36.0–46.0)
Hemoglobin: 11.4 g/dL — ABNORMAL LOW (ref 12.0–15.0)
Immature Granulocytes: 0 %
Lymphocytes Relative: 30 %
Lymphs Abs: 2.9 10*3/uL (ref 0.7–4.0)
MCH: 28.8 pg (ref 26.0–34.0)
MCHC: 32 g/dL (ref 30.0–36.0)
MCV: 89.9 fL (ref 80.0–100.0)
Monocytes Absolute: 0.6 10*3/uL (ref 0.1–1.0)
Monocytes Relative: 6 %
Neutro Abs: 6.1 10*3/uL (ref 1.7–7.7)
Neutrophils Relative %: 63 %
Platelets: 290 10*3/uL (ref 150–400)
RBC: 3.96 MIL/uL (ref 3.87–5.11)
RDW: 14.9 % (ref 11.5–15.5)
WBC: 9.7 10*3/uL (ref 4.0–10.5)
nRBC: 0 % (ref 0.0–0.2)

## 2020-04-29 LAB — RESP PANEL BY RT-PCR (FLU A&B, COVID) ARPGX2
Influenza A by PCR: NEGATIVE
Influenza B by PCR: NEGATIVE
SARS Coronavirus 2 by RT PCR: POSITIVE — AB

## 2020-04-29 LAB — BASIC METABOLIC PANEL
Anion gap: 9 (ref 5–15)
BUN: 9 mg/dL (ref 6–20)
CO2: 29 mmol/L (ref 22–32)
Calcium: 8.9 mg/dL (ref 8.9–10.3)
Chloride: 102 mmol/L (ref 98–111)
Creatinine, Ser: 0.86 mg/dL (ref 0.44–1.00)
GFR, Estimated: >60 mL/min (ref >60)
Glucose, Bld: 107 mg/dL — ABNORMAL HIGH (ref 70–99)
Potassium: 3.4 mmol/L — ABNORMAL LOW (ref 3.5–5.1)
Sodium: 140 mmol/L (ref 135–145)

## 2020-04-29 MED ORDER — ONDANSETRON HCL 4 MG/2ML IJ SOLN
4.0000 mg | Freq: Once | INTRAMUSCULAR | Status: AC
  Administered 2020-04-29: 4 mg via INTRAVENOUS
  Filled 2020-04-29: qty 2

## 2020-04-29 MED ORDER — SODIUM CHLORIDE 0.9 % IV SOLN: INTRAVENOUS | Status: DC

## 2020-04-29 MED ORDER — HYDROMORPHONE HCL 1 MG/ML IJ SOLN
1.0000 mg | Freq: Once | INTRAMUSCULAR | Status: AC
  Administered 2020-04-29: 1 mg via INTRAVENOUS
  Filled 2020-04-29: qty 1

## 2020-04-29 MED ORDER — KETAMINE HCL 10 MG/ML IJ SOLN
INTRAMUSCULAR | Status: AC | PRN
  Administered 2020-04-29: 50 mg via INTRAVENOUS

## 2020-04-29 MED ORDER — KETAMINE HCL 50 MG/5ML IJ SOSY
1.0000 mg/kg | PREFILLED_SYRINGE | Freq: Once | INTRAMUSCULAR | Status: DC
  Filled 2020-04-29: qty 15

## 2020-04-29 MED ORDER — METHOCARBAMOL 500 MG PO TABS: 500.0000 mg | ORAL_TABLET | Freq: Four times a day (QID) | ORAL | Status: DC | PRN

## 2020-04-29 MED ORDER — HYDROCODONE-ACETAMINOPHEN 5-325 MG PO TABS
1.0000 | ORAL_TABLET | ORAL | Status: DC | PRN
  Filled 2020-04-29: qty 2

## 2020-04-29 MED ORDER — PROPOFOL 10 MG/ML IV BOLUS
0.5000 mg/kg | Freq: Once | INTRAVENOUS | Status: DC
  Filled 2020-04-29: qty 20

## 2020-04-29 MED ORDER — PROPOFOL 10 MG/ML IV BOLUS
INTRAVENOUS | Status: AC | PRN
  Administered 2020-04-29 (×2): 25 mg via INTRAVENOUS

## 2020-04-29 MED ORDER — FENTANYL CITRATE (PF) 100 MCG/2ML IJ SOLN
25.0000 ug | INTRAMUSCULAR | Status: DC | PRN
  Administered 2020-04-30: 25 ug via INTRAVENOUS
  Filled 2020-04-29: qty 2

## 2020-04-29 MED ORDER — HYDROCODONE-ACETAMINOPHEN 7.5-325 MG PO TABS: 1.0000 | ORAL_TABLET | ORAL | Status: DC | PRN

## 2020-04-29 MED ORDER — DIPHENHYDRAMINE HCL 12.5 MG/5ML PO ELIX: 12.5000 mg | ORAL_SOLUTION | ORAL | Status: DC | PRN

## 2020-04-29 MED ORDER — ONDANSETRON HCL 4 MG/2ML IJ SOLN: 4.0000 mg | Freq: Four times a day (QID) | INTRAMUSCULAR | Status: DC | PRN

## 2020-04-29 MED ORDER — METHOCARBAMOL 1000 MG/10ML IJ SOLN
500.0000 mg | Freq: Four times a day (QID) | INTRAVENOUS | Status: DC | PRN
  Filled 2020-04-29: qty 5

## 2020-04-29 MED ORDER — DOCUSATE SODIUM 100 MG PO CAPS
100.0000 mg | ORAL_CAPSULE | Freq: Two times a day (BID) | ORAL | Status: DC
  Administered 2020-04-29 (×2): 100 mg via ORAL
  Filled 2020-04-29 (×2): qty 1

## 2020-04-29 MED ORDER — ONDANSETRON HCL 4 MG PO TABS: 4.0000 mg | ORAL_TABLET | Freq: Four times a day (QID) | ORAL | Status: DC | PRN

## 2020-04-29 MED ORDER — ACETAMINOPHEN 325 MG PO TABS: 325.0000 mg | ORAL_TABLET | Freq: Four times a day (QID) | ORAL | Status: DC | PRN

## 2020-04-29 NOTE — ED Provider Notes (Signed)
[facility]-EMERGENCY DEPT Provider Note   CSN: [id number] Arrival date & time: 04/29/20  7494     History Chief Complaint  Patient presents with  . Dislocation    Left hip pain, had surgery 3 weeks ago.      Taylor Gillespie is a 55 y.o. female.  55 year old female presents with left hip pain.  Patient had recent left hip replacement 3 weeks ago and was seen 2 days ago after she had dislocation of the left hip prosthesis.  Was relocated in the ED.  States last night she was sitting down and shifted her weight and felt her hip pop out again.  Denies any distal numbness or tingling to her left foot.  Cannot ambulate call EMS and transported here        Past Medical History:  Diagnosis Date  . Arthritis   . Depression   . Dizziness   . Dizziness and giddiness   . Endometriosis   . Hypertension   . Osteoarthritis   . 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 Spinal stenosis, cervical region    C5-6, C6-7    Patient Active Problem List   Diagnosis Date Noted  . HTN (hypertension) 04/25/2014  . Depression 04/25/2014  . Chronic back pain 04/25/2014    Past Surgical History:  Procedure Laterality Date  . BACK SURGERY  2004  . COLONOSCOPY  05/2014   gessner, repeat 5 yrs, [facility]  . JOINT REPLACEMENT    . TOTAL ABDOMINAL HYSTERECTOMY  2003  . UPPER GI ENDOSCOPY  05/2014   [facility] History   No obstetric history on file.     Family History  Problem Relation Age of Onset  . Colon cancer Father        deceased  . Diabetes Mellitus II Father   . Hypertension Father   . Diabetes Mother        deceased  . Arthritis Mother   . Hypertension Mother   . Hypertension Brother   . Hypertension Sister        two sisters  . Breast cancer Maternal Grandmother     Social History   Tobacco Use  . Smoking status: Former Smoker    Packs/day: 0.50    Years: 30.00     Pack years: 15.00    Types: Cigarettes  . Smokeless tobacco: Never Used  Vaping Use  . Vaping Use: Never used  Substance Use Topics  . Alcohol use: Yes    Alcohol/week: 0.0 standard drinks    Comment: Occasional Beer/Wine  . Drug use: No    Home Medications Prior to Admission medications   Medication Sig Start Date End Date Taking? Authorizing Provider  cholecalciferol (VITAMIN D) 1000 UNITS tablet Take 3,000 Units by mouth daily after lunch.     [provider]  FLUoxetine (PROZAC) 20 MG capsule Take 20 mg by mouth daily after lunch.  11/10/15   [provider]  ibuprofen (ADVIL,MOTRIN) 200 MG tablet Take 400 mg by mouth 2 (two) times daily as needed (pain.).     [provider]  losartan (COZAAR) 50 MG tablet Take 50 mg by mouth daily after lunch.     [provider]  Magnesium 500 MG CAPS Take 500 mg by mouth once a week.     [provider]  naproxen (NAPROSYN) 500 MG  tablet Take 500 mg by mouth 2 (two) times daily as needed (pain/inflammation.).     [provider]  pantoprazole (PROTONIX) 40 MG tablet TAKE 1 TABLET (40 MG TOTAL) BY MOUTH DAILY. Patient taking differently: Take 40 mg by mouth daily after lunch.  06/01/15   Iva Boop, MD  Potassium 99 MG TABS Take 198 mg by mouth every 3 (three) days.     [provider]  TURMERIC CURCUMIN PO Take 1,950 mg by mouth 2 (two) times a week.    [provider]  vitamin B-12 (CYANOCOBALAMIN) 500 MCG tablet Take 500 mcg by mouth once a week.    [provider]    Allergies    Diclofenac, Ferrous sulfate, Morphine, Lisinopril, and Shellfish allergy  Review of Systems   Review of Systems  All other systems reviewed and are negative.   Physical Exam Updated Vital Signs BP (!) 145/77 (BP Location: Left Arm)   Pulse 66   Temp 98.2 F (36.8 C) (Oral)   Resp 19   LMP  (LMP Unknown)   SpO2 97%   Physical Exam Vitals and nursing note reviewed.   Constitutional:      General: She is not in acute distress.    Appearance: Normal appearance. She is well-developed and well-nourished. She is not toxic-appearing.  HENT:     Head: Normocephalic and atraumatic.  Eyes:     General: Lids are normal.     Extraocular Movements: EOM normal.     Conjunctiva/sclera: Conjunctivae normal.     Pupils: Pupils are equal, round, and reactive to light.  Neck:     Thyroid: No thyroid mass.     Trachea: No tracheal deviation.  Cardiovascular:     Rate and Rhythm: Normal rate and regular rhythm.     Heart sounds: Normal heart sounds. No murmur heard. No gallop.   Pulmonary:     Effort: Pulmonary effort is normal. No respiratory distress.     Breath sounds: Normal breath sounds. No stridor. No decreased breath sounds, wheezing, rhonchi or rales.  Abdominal:     General: Bowel sounds are normal. There is no distension.     Palpations: Abdomen is soft.     Tenderness: There is no abdominal tenderness. There is no CVA tenderness or rebound.  Musculoskeletal:        General: No tenderness or edema. Normal range of motion.     Cervical back: Normal range of motion and neck supple.       Legs:     Comments: Neurovascular intact at left foot  Skin:    General: Skin is warm and dry.     Findings: No abrasion or rash.  Neurological:     Mental Status: She is alert and oriented to person, place, and time.     GCS: GCS eye subscore is 4. GCS verbal subscore is 5. GCS motor subscore is 6.     Cranial Nerves: No cranial nerve deficit.     Sensory: No sensory deficit.     Deep Tendon Reflexes: Strength normal.  Psychiatric:        Mood and Affect: Mood and affect normal.        Speech: Speech normal.        Behavior: Behavior normal.     ED Results / Procedures / Treatments   Labs (all labs ordered are listed, but only abnormal results are displayed) Labs Reviewed  CBC WITH DIFFERENTIAL/PLATELET  BASIC METABOLIC PANEL  EKG None  Radiology DG Hip Unilat W or Wo Pelvis 2-3 Views Left  Result Date: 04/27/2020 CLINICAL DATA:  Postreduction of LEFT hip arthroplasty. EXAM: DG HIP (WITH OR WITHOUT PELVIS) 2-3V LEFT COMPARISON:  04/27/2020 FINDINGS: Patient has undergone interval reduction of LEFT hip arthroplasty. The femoral head component is located within the acetabular component. Remote lumbar fusion. IMPRESSION: Successful reduction of LEFT hip arthroplasty. Electronically Signed   By: Norva PavlovElizabeth  Brown M.D.   On: 04/27/2020 21:35   DG Hip Unilat W or Wo Pelvis 2-3 Views Left  Result Date: 04/27/2020 CLINICAL DATA:  Left hip dislocation, bilateral hip arthroplasties EXAM: DG HIP (WITH OR WITHOUT PELVIS) 2-3V LEFT COMPARISON:  None. FINDINGS: Frontal view of the pelvis as well as frontal and frogleg lateral views of the left hip are obtained. There is superolateral dislocation of the femoral component of the left hip arthroplasty. I do not see any acute displaced fractures. The right hip prosthesis remains well aligned. No acute bony abnormalities. Postsurgical changes at the lumbosacral junction. IMPRESSION: 1. Superolateral dislocation of the femoral component of the left hip arthroplasty. Electronically Signed   By: Sharlet SalinaMichael  Brown M.D.   On: 04/27/2020 19:14    Procedures Reduction of dislocation  Date/Time: 04/29/2020 10:35 AM Performed by: Lorre NickAllen, Lorrena Goranson, MD Authorized by: Lorre NickAllen, Assad Harbeson, MD  Consent: Written consent obtained. Risks and benefits: risks, benefits and alternatives were discussed Consent given by: patient Patient understanding: patient states understanding of the procedure being performed Imaging studies: imaging studies available Required items: required blood products, implants, devices, and special equipment available Patient identity confirmed: verbally with patient and arm band Time out: Immediately prior to procedure a "time out" was called to verify the correct patient,  procedure, equipment, support staff and site/side marked as required. Preparation: Patient was prepped and draped in the usual sterile fashion. Local anesthesia used: no  Anesthesia: Local anesthesia used: no  Sedation: Patient sedated: yes Sedatives: propofol Analgesia: ketamine Sedation start date/time: 04/29/2020 10:00 AM Sedation end date/time: 04/29/2020 10:20 AM Vitals: Vital signs were monitored during sedation.  Patient tolerance: patient tolerated the procedure well with no immediate complications Comments: [name] hip reduced using traction and countertraction  .Sedation  Date/Time: 04/29/2020 10:36 AM Performed by: Lorre NickAllen, Robecca Fulgham, MD Authorized by: Lorre NickAllen, Bensyn Bornemann, MD   Consent:    Consent obtained:  Written   Consent given by:  Patient   Risks discussed:  Allergic reaction   Alternatives discussed:  Analgesia without sedation Universal protocol:    Immediately prior to procedure, a time out was called: yes     Patient identity confirmed:  Arm band Indications:    Procedure performed:  Dislocation reduction   Procedure necessitating sedation performed by:  Physician performing sedation Pre-sedation assessment:    Time since last food or drink:  12hours   ASA classification: class 1 - normal, healthy patient     Mallampati score:  I - soft palate, uvula, fauces, pillars visible   Neck mobility: normal     Pre-sedation assessments completed and reviewed: airway patency     Pre-sedation assessment completed:  04/29/2020 10:37 AM Immediate pre-procedure details:    Reassessment: Patient reassessed immediately prior to procedure     Reviewed: vital signs     Verified: bag valve mask available, emergency equipment available, intubation equipment available and IV patency confirmed   Procedure details (see MAR for exact dosages):    Preoxygenation:  Nasal cannula   Sedation:  Propofol   Intended level of sedation: deep  Intra-procedure monitoring:  Cardiac monitor,  blood pressure monitoring, continuous pulse oximetry and continuous capnometry   Intra-procedure events: none     Total Provider sedation time (minutes):  20 Post-procedure details:    Post-sedation assessment completed:  04/29/2020 10:38 AM   Attendance: Constant attendance by certified staff until patient recovered     Recovery: Patient returned to pre-procedure baseline     Post-sedation assessments completed and reviewed: airway patency, cardiovascular function, hydration status and pain level     Patient is stable for discharge or admission: yes       Medications Ordered in ED Medications  0.9 %  sodium chloride infusion (has no administration in time range)  HYDROmorphone (DILAUDID) injection 1 mg (has no administration in time range)  ondansetron (ZOFRAN) injection 4 mg (has no administration in time range)    ED Course  I have reviewed the triage vital signs and the nursing notes.  Pertinent labs & imaging results that were available during my care of the patient were reviewed by me and considered in my medical decision making (see chart for details).    MDM Rules/Calculators/A&P                          Discussed with Dr. Charlann Boxer who requested patient's hip be relocated and he will see the patient afterwards.  [name] hip has been reduced and primary to rotation appears to be relocated.  Message sent to Dr. Constance Goltz who will see the patient in the ED Final Clinical Impression(s) / ED Diagnoses Final diagnoses:  None    Rx / DC Orders ED Discharge Orders    None       Lorre Nick, MD 04/29/20 1039

## 2020-04-29 NOTE — ED Notes (Signed)
Per Dr. Freida Busman pt. Could have a few ice chips. Nurse aware.

## 2020-04-29 NOTE — ED Notes (Addendum)
RN aware of BP 

## 2020-04-29 NOTE — ED Triage Notes (Signed)
Pt had surgery on left hip 3 weeks ago.  Presents tonight due to displacement of left hip. Pt states Monday night she was sitting in chair and shifted her hips when she felt a pop.  Pt went to [place] at that time and had it reduced in the ER.  Tonight same situation happened again and she heard/felt the pop noise.  Pt has been NPO since 7:30pm 04/28/2020.  Pt reports having one sip of water only at 3am this morning.  Left leg shortened slightly with immobilizer in place. Pt a&ox4, on ra.

## 2020-04-29 NOTE — ED Notes (Signed)
Informed pt of covid status and made them aware of hospital rule of no visitors at this time. Pt stated she and her husband understood. Pts husband voiced that he would be going to get tested as well.

## 2020-04-29 NOTE — H&P (Signed)
TOTAL HIP REVISION ADMISSION H&P  Patient is admitted for left revision total hip arthroplasty, anterior approach.  Subjective:  Chief Complaint: Left hip instability s/p THA  HPI: [facility], 55 y.o. female, has a history of pain and functional disability in the left hip due to instability and multiple dislocations and patient has failed non-surgical conservative treatments for greater than 12 weeks to include NSAID's and/or analgesics, use of assistive devices, activity modification and reduction x2. The indications for the revision total hip arthroplasty are instability with multiple dislocations.  Onset of symptoms was abrupt starting this week.  Prior procedures on the left hip include arthroplasty.  Patient currently rates pain in the left hip at 10 out of 10 with activity.   Patient has evidence of previous THA by imaging studies.  This condition presents safety issues increasing the risk of falls.   There is no current evidence of active infection.  Risks, benefits and expectations were discussed with the patient.  Risks including but not limited to the risk of anesthesia, blood clots, nerve damage, blood vessel damage, failure of the prosthesis, infection and up to and including death.  Patient understand the risks, benefits and expectations and wishes to proceed with surgery.    Patient Active Problem List   Diagnosis Date Noted  . HTN (hypertension) 04/25/2014  . Depression 04/25/2014  . Chronic back pain 04/25/2014   Past Medical History:  Diagnosis Date  . Arthritis   . Depression   . Dizziness   . Dizziness and giddiness   . Endometriosis   . Hypertension   . Osteoarthritis   . 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 Spinal stenosis, cervical region    C5-6, C6-7    Past Surgical History:  Procedure Laterality Date  . BACK SURGERY  2004  . COLONOSCOPY  05/2014   gessner, repeat 5 yrs,  [facility]  . JOINT REPLACEMENT    . TOTAL ABDOMINAL HYSTERECTOMY  2003  . UPPER GI ENDOSCOPY  05/2014   [facility]    Current Facility-Administered Medications  Medication Dose Route Frequency Provider Last Rate Last Admin  . 0.9 %  sodium chloride infusion   Intravenous Continuous Lorre Nick, MD   Stopped at 04/29/20 1314  . ketamine 50 mg in normal saline 5 mL (10 mg/mL) syringe  1 mg/kg Intravenous Once Lorre Nick, MD      . propofol (DIPRIVAN) 10 mg/mL bolus/IV push 57 mg  0.5 mg/kg Intravenous Once Lorre Nick, MD       Current Outpatient Medications  Medication Sig Dispense Refill Last Dose  . acetaminophen (TYLENOL) 325 MG tablet Take 650 mg by mouth every 6 (six) hours as needed for mild pain, fever or headache.   Past Week at Unknown time  . Ascorbic Acid (VITAMIN C PO) Take 1 tablet by mouth daily.   04/28/2020 at Unknown time  . cholecalciferol (VITAMIN D) 1000 UNITS tablet Take 3,000 Units by mouth daily after lunch.    04/28/2020 at Unknown time  . [facility] ASPIRIN ADULT LOW DOSE 81 MG chewable tablet Chew 81 mg by mouth 2 (two) times daily.   04/28/2020 at Unknown time  . FLUoxetine (PROZAC) 20 MG capsule Take 20 mg by mouth daily after lunch.    04/28/2020 at Unknown time  . losartan-hydrochlorothiazide (HYZAAR) 50-12.5 MG tablet Take 1 tablet by mouth daily.   04/28/2020 at Unknown time  .  Menthol, Topical Analgesic, (ICY HOT EX) Apply 1 application topically as needed (pain).   Past Week at Unknown time  . pantoprazole (PROTONIX) 40 MG tablet TAKE 1 TABLET (40 MG TOTAL) BY MOUTH DAILY. (Patient taking differently: Take 40 mg by mouth daily after lunch.) 30 tablet 5 04/28/2020 at Unknown time  . VITAMIN E PO Take 1 tablet by mouth daily.   04/28/2020 at Unknown time   Allergies  Allergen Reactions  . Diclofenac Other (See Comments)  . Ferrous Sulfate Other (See Comments)  . Morphine Nausea And Vomiting  . Lisinopril Cough  . Shellfish Allergy Rash    Sea food allergy. Sea food  allergy.    Social History   Tobacco Use  . Smoking status: Former Smoker    Packs/day: 0.50    Years: 30.00    Pack years: 15.00    Types: Cigarettes  . Smokeless tobacco: Never Used  Substance Use Topics  . Alcohol use: Yes    Alcohol/week: 0.0 standard drinks    Comment: Occasional Beer/Wine    Family History  Problem Relation Age of Onset  . Colon cancer Father        deceased  . Diabetes Mellitus II Father   . Hypertension Father   . Diabetes Mother        deceased  . Arthritis Mother   . Hypertension Mother   . Hypertension Brother   . Hypertension Sister        two sisters  . Breast cancer Maternal Grandmother      Review of Systems  Constitutional: Negative.   HENT: Negative.   Eyes: Negative.   Respiratory: Negative.   Cardiovascular: Negative.   Gastrointestinal: Negative.   Genitourinary: Negative.   Musculoskeletal: Positive for joint pain.  Skin: Negative.   Neurological: Negative.   Endo/Heme/Allergies: Negative.   Psychiatric/Behavioral: Positive for depression.     Objective:  Physical Exam Constitutional:      Appearance: She is well-developed.  HENT:     Head: Normocephalic.  Eyes:     Pupils: Pupils are equal, round, and reactive to light.  Neck:     Thyroid: No thyromegaly.     Vascular: No JVD.     Trachea: No tracheal deviation.  Cardiovascular:     Rate and Rhythm: Normal rate and regular rhythm.     Pulses: Intact distal pulses.  Pulmonary:     Effort: Pulmonary effort is normal. No respiratory distress.     Breath sounds: Normal breath sounds. No wheezing.  Abdominal:     Palpations: Abdomen is soft.     Tenderness: There is no abdominal tenderness. There is no guarding.  Musculoskeletal:     Cervical back: Neck supple.     Left hip: Tenderness and bony tenderness present. Decreased range of motion. Decreased strength.  Lymphadenopathy:     Cervical: No cervical adenopathy.  Skin:    General: Skin is warm and dry.   Neurological:     Mental Status: She is alert and oriented to person, place, and time.  Psychiatric:        Mood and Affect: Mood and affect normal.     Vital signs in last 24 hours: Temp:  [98.2 F (36.8 C)] 98.2 F (36.8 C) (03/02 0704) Pulse Rate:  [62-80] 78 (03/02 1230) Resp:  [14-20] 15 (03/02 1230) BP: (115-160)/(71-105) 160/86 (03/02 1230) SpO2:  [93 %-99 %] 98 % (03/02 1230)   Labs:   Estimated body mass index is 39.31 kg/m  as calculated from the following:   Height as of 04/27/20: 5\' 7"  (1.702 m).   Weight as of 04/27/20: 113.9 kg.  Imaging Review:  Plain radiographs demonstrate previous THA of the left hip.     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 04/29/20 discussed with the patient that due to the instability, multiple dislocations and her fear that she may dislocate again she will undergo a left hip revision.   Plan for surgery per Dr. Charlann Boxer tomorrow  NPO after midnight  Orders placed  Risks, benefits and expectations were discussed with the patient.  Risks including but not limited to the risk of anesthesia, blood clots, nerve damage, blood vessel damage, failure of the prosthesis, infection and up to and including death.  Patient understand the risks, benefits and expectations and wishes to proceed with surgery.

## 2020-04-29 NOTE — Progress Notes (Signed)
Orthopedic Tech Progress Note Patient Details:  JURLINE FOLGER 11/23/1965 [id number]  Patient ID: Lacretia Nicks, female   DOB: 1965-03-24, 55 y.o.   MRN: [id number]   Saul Fordyce 04/29/2020, 10:56 AMHip reduction

## 2020-04-30 ENCOUNTER — Observation Stay (HOSPITAL_COMMUNITY): Payer: BC Managed Care – PPO

## 2020-04-30 ENCOUNTER — Observation Stay (HOSPITAL_COMMUNITY): Payer: BC Managed Care – PPO | Admitting: Registered Nurse

## 2020-04-30 ENCOUNTER — Encounter (HOSPITAL_COMMUNITY): Payer: Self-pay | Admitting: Orthopedic Surgery

## 2020-04-30 ENCOUNTER — Encounter (HOSPITAL_COMMUNITY)
Admission: EM | Disposition: A | Discharge status: Home / Self Care | Payer: Self-pay | Source: Home / Self Care | Attending: Orthopedic Surgery

## 2020-04-30 DIAGNOSIS — M549 Dorsalgia, unspecified: Secondary | ICD-10-CM | POA: Diagnosis present

## 2020-04-30 DIAGNOSIS — Z7982 Long term (current) use of aspirin: Secondary | ICD-10-CM | POA: Diagnosis not present

## 2020-04-30 DIAGNOSIS — Z96641 Presence of right artificial hip joint: Secondary | ICD-10-CM | POA: Diagnosis present

## 2020-04-30 DIAGNOSIS — Z886 Allergy status to analgesic agent status: Secondary | ICD-10-CM | POA: Diagnosis not present

## 2020-04-30 DIAGNOSIS — Z87891 Personal history of nicotine dependence: Secondary | ICD-10-CM | POA: Diagnosis not present

## 2020-04-30 DIAGNOSIS — Z90710 Acquired absence of both cervix and uterus: Secondary | ICD-10-CM | POA: Diagnosis not present

## 2020-04-30 DIAGNOSIS — Z79899 Other long term (current) drug therapy: Secondary | ICD-10-CM | POA: Diagnosis not present

## 2020-04-30 DIAGNOSIS — Z885 Allergy status to narcotic agent status: Secondary | ICD-10-CM | POA: Diagnosis not present

## 2020-04-30 DIAGNOSIS — Z888 Allergy status to other drugs, medicaments and biological substances status: Secondary | ICD-10-CM | POA: Diagnosis not present

## 2020-04-30 DIAGNOSIS — F32A Depression, unspecified: Secondary | ICD-10-CM | POA: Diagnosis present

## 2020-04-30 DIAGNOSIS — U071 COVID-19: Secondary | ICD-10-CM | POA: Diagnosis present

## 2020-04-30 DIAGNOSIS — Z91013 Allergy to seafood: Secondary | ICD-10-CM | POA: Diagnosis not present

## 2020-04-30 DIAGNOSIS — Z981 Arthrodesis status: Secondary | ICD-10-CM | POA: Diagnosis not present

## 2020-04-30 DIAGNOSIS — G8929 Other chronic pain: Secondary | ICD-10-CM | POA: Diagnosis present

## 2020-04-30 DIAGNOSIS — M25352 Other instability, left hip: Secondary | ICD-10-CM | POA: Diagnosis present

## 2020-04-30 DIAGNOSIS — Z8261 Family history of arthritis: Secondary | ICD-10-CM | POA: Diagnosis not present

## 2020-04-30 DIAGNOSIS — I10 Essential (primary) hypertension: Secondary | ICD-10-CM | POA: Diagnosis present

## 2020-04-30 DIAGNOSIS — T84021A Dislocation of internal left hip prosthesis, initial encounter: Secondary | ICD-10-CM | POA: Diagnosis present

## 2020-04-30 DIAGNOSIS — Z8249 Family history of ischemic heart disease and other diseases of the circulatory system: Secondary | ICD-10-CM | POA: Diagnosis not present

## 2020-04-30 DIAGNOSIS — Y792 Prosthetic and other implants, materials and accessory orthopedic devices associated with adverse incidents: Secondary | ICD-10-CM | POA: Diagnosis present

## 2020-04-30 DIAGNOSIS — M1990 Unspecified osteoarthritis, unspecified site: Secondary | ICD-10-CM | POA: Diagnosis present

## 2020-04-30 HISTORY — PX: TOTAL HIP ARTHROPLASTY: SHX124

## 2020-04-30 LAB — CBC
HCT: 37.4 % (ref 36.0–46.0)
Hemoglobin: 11.9 g/dL — ABNORMAL LOW (ref 12.0–15.0)
MCH: 28.9 pg (ref 26.0–34.0)
MCHC: 31.8 g/dL (ref 30.0–36.0)
MCV: 90.8 fL (ref 80.0–100.0)
Platelets: 297 10*3/uL (ref 150–400)
RBC: 4.12 MIL/uL (ref 3.87–5.11)
RDW: 14.7 % (ref 11.5–15.5)
WBC: 10.3 10*3/uL (ref 4.0–10.5)
nRBC: 0 % (ref 0.0–0.2)

## 2020-04-30 LAB — BASIC METABOLIC PANEL
Anion gap: 6 (ref 5–15)
BUN: 12 mg/dL (ref 6–20)
CO2: 31 mmol/L (ref 22–32)
Calcium: 9 mg/dL (ref 8.9–10.3)
Chloride: 105 mmol/L (ref 98–111)
Creatinine, Ser: 0.81 mg/dL (ref 0.44–1.00)
GFR, Estimated: >60 mL/min (ref >60)
Glucose, Bld: 112 mg/dL — ABNORMAL HIGH (ref 70–99)
Potassium: 3.4 mmol/L — ABNORMAL LOW (ref 3.5–5.1)
Sodium: 142 mmol/L (ref 135–145)

## 2020-04-30 LAB — HIV ANTIBODY (ROUTINE TESTING W REFLEX): HIV Screen 4th Generation wRfx: NONREACTIVE

## 2020-04-30 SURGERY — ARTHROPLASTY, HIP, TOTAL, ANTERIOR APPROACH
Anesthesia: Spinal | Site: Hip | Laterality: Left

## 2020-04-30 MED ORDER — DOCUSATE SODIUM 100 MG PO CAPS
100.0000 mg | ORAL_CAPSULE | Freq: Two times a day (BID) | ORAL | Status: DC
  Administered 2020-04-30 – 2020-05-01 (×2): 100 mg via ORAL
  Filled 2020-04-30 (×2): qty 1

## 2020-04-30 MED ORDER — ONDANSETRON HCL 4 MG PO TABS
4.0000 mg | ORAL_TABLET | Freq: Four times a day (QID) | ORAL | Status: DC | PRN
  Filled 2020-04-30: qty 1

## 2020-04-30 MED ORDER — ASPIRIN 81 MG PO CHEW
81.0000 mg | CHEWABLE_TABLET | Freq: Two times a day (BID) | ORAL | Status: DC
  Administered 2020-04-30 – 2020-05-01 (×2): 81 mg via ORAL
  Filled 2020-04-30 (×2): qty 1

## 2020-04-30 MED ORDER — BUPIVACAINE IN DEXTROSE 0.75-8.25 % IT SOLN
INTRATHECAL | Status: DC | PRN
  Administered 2020-04-30: 2 mL via INTRATHECAL

## 2020-04-30 MED ORDER — MAGNESIUM CITRATE PO SOLN: 1.0000 | Freq: Once | ORAL | Status: DC | PRN

## 2020-04-30 MED ORDER — ONDANSETRON HCL 4 MG/2ML IJ SOLN
4.0000 mg | Freq: Four times a day (QID) | INTRAMUSCULAR | Status: DC | PRN
  Administered 2020-04-30 – 2020-05-01 (×2): 4 mg via INTRAVENOUS
  Filled 2020-04-30 (×2): qty 2

## 2020-04-30 MED ORDER — ACETAMINOPHEN 10 MG/ML IV SOLN: 1000.0000 mg | Freq: Once | INTRAVENOUS | Status: DC | PRN

## 2020-04-30 MED ORDER — AMISULPRIDE (ANTIEMETIC) 5 MG/2ML IV SOLN: 10.0000 mg | Freq: Once | INTRAVENOUS | Status: DC | PRN

## 2020-04-30 MED ORDER — PANTOPRAZOLE SODIUM 40 MG PO TBEC: 40.0000 mg | DELAYED_RELEASE_TABLET | Freq: Every day | ORAL | Status: DC

## 2020-04-30 MED ORDER — HYDROCODONE-ACETAMINOPHEN 5-325 MG PO TABS: 1.0000 | ORAL_TABLET | ORAL | Status: DC | PRN

## 2020-04-30 MED ORDER — HYDROMORPHONE HCL 1 MG/ML IJ SOLN
INTRAMUSCULAR | Status: DC | PRN
  Administered 2020-04-30: .5 mg via INTRAVENOUS

## 2020-04-30 MED ORDER — ONDANSETRON HCL 4 MG/2ML IJ SOLN
INTRAMUSCULAR | Status: AC
  Filled 2020-04-30: qty 2

## 2020-04-30 MED ORDER — METHOCARBAMOL 500 MG IVPB - SIMPLE MED
500.0000 mg | Freq: Four times a day (QID) | INTRAVENOUS | Status: DC | PRN
  Filled 2020-04-30: qty 50

## 2020-04-30 MED ORDER — ALUM & MAG HYDROXIDE-SIMETH 200-200-20 MG/5ML PO SUSP: 15.0000 mL | ORAL | Status: DC | PRN

## 2020-04-30 MED ORDER — CEFAZOLIN SODIUM-DEXTROSE 2-4 GM/100ML-% IV SOLN
2.0000 g | Freq: Four times a day (QID) | INTRAVENOUS | Status: AC
Start: 2020-04-30 — End: 2020-05-01
  Administered 2020-04-30 – 2020-05-01 (×2): 2 g via INTRAVENOUS
  Filled 2020-04-30 (×2): qty 100

## 2020-04-30 MED ORDER — SODIUM CHLORIDE 0.9 % IV SOLN: INTRAVENOUS | Status: DC

## 2020-04-30 MED ORDER — ROCURONIUM BROMIDE 10 MG/ML (PF) SYRINGE
PREFILLED_SYRINGE | INTRAVENOUS | Status: AC
  Filled 2020-04-30: qty 10

## 2020-04-30 MED ORDER — PROPOFOL 10 MG/ML IV BOLUS
INTRAVENOUS | Status: DC | PRN
  Administered 2020-04-30: 30 mg via INTRAVENOUS

## 2020-04-30 MED ORDER — PHENOL 1.4 % MT LIQD
1.0000 | OROMUCOSAL | Status: DC | PRN
  Filled 2020-04-30: qty 177

## 2020-04-30 MED ORDER — LOSARTAN POTASSIUM 50 MG PO TABS
50.0000 mg | ORAL_TABLET | Freq: Every day | ORAL | Status: DC
  Administered 2020-05-01: 50 mg via ORAL
  Filled 2020-04-30: qty 1

## 2020-04-30 MED ORDER — FENTANYL CITRATE (PF) 250 MCG/5ML IJ SOLN
INTRAMUSCULAR | Status: DC | PRN
  Administered 2020-04-30: 50 ug via INTRAVENOUS

## 2020-04-30 MED ORDER — ONDANSETRON HCL 4 MG/2ML IJ SOLN
INTRAMUSCULAR | Status: DC | PRN
  Administered 2020-04-30: 4 mg via INTRAVENOUS

## 2020-04-30 MED ORDER — METOCLOPRAMIDE HCL 5 MG/ML IJ SOLN
5.0000 mg | Freq: Three times a day (TID) | INTRAMUSCULAR | Status: DC | PRN
  Administered 2020-05-01: 10 mg via INTRAVENOUS
  Filled 2020-04-30: qty 2

## 2020-04-30 MED ORDER — TRANEXAMIC ACID-NACL 1000-0.7 MG/100ML-% IV SOLN
1000.0000 mg | INTRAVENOUS | Status: AC
  Administered 2020-04-30: 1000 mg via INTRAVENOUS
  Filled 2020-04-30: qty 100

## 2020-04-30 MED ORDER — SODIUM CHLORIDE 0.9 % IR SOLN
Status: DC | PRN
  Administered 2020-04-30: 1000 mL

## 2020-04-30 MED ORDER — DEXAMETHASONE SODIUM PHOSPHATE 10 MG/ML IJ SOLN
INTRAMUSCULAR | Status: AC
  Filled 2020-04-30: qty 1

## 2020-04-30 MED ORDER — LIDOCAINE HCL (PF) 2 % IJ SOLN
INTRAMUSCULAR | Status: AC
  Filled 2020-04-30: qty 5

## 2020-04-30 MED ORDER — DEXTROSE 5 % IV SOLN
3.0000 g | INTRAVENOUS | Status: AC
  Administered 2020-04-30: 3 g via INTRAVENOUS
  Filled 2020-04-30: qty 3

## 2020-04-30 MED ORDER — POLYETHYLENE GLYCOL 3350 17 G PO PACK
17.0000 g | PACK | Freq: Two times a day (BID) | ORAL | Status: DC
  Administered 2020-04-30 – 2020-05-01 (×2): 17 g via ORAL
  Filled 2020-04-30 (×2): qty 1

## 2020-04-30 MED ORDER — MEPERIDINE HCL 50 MG/ML IJ SOLN: 6.2500 mg | INTRAMUSCULAR | Status: DC | PRN

## 2020-04-30 MED ORDER — PROPOFOL 10 MG/ML IV BOLUS
INTRAVENOUS | Status: AC
  Filled 2020-04-30: qty 20

## 2020-04-30 MED ORDER — LACTATED RINGERS IV SOLN: INTRAVENOUS | Status: DC

## 2020-04-30 MED ORDER — DEXAMETHASONE SODIUM PHOSPHATE 10 MG/ML IJ SOLN
10.0000 mg | Freq: Once | INTRAMUSCULAR | Status: AC
  Administered 2020-05-01: 10 mg via INTRAVENOUS
  Filled 2020-04-30: qty 1

## 2020-04-30 MED ORDER — LOSARTAN POTASSIUM-HCTZ 50-12.5 MG PO TABS: 1.0000 | ORAL_TABLET | Freq: Every day | ORAL | Status: DC

## 2020-04-30 MED ORDER — CHLORHEXIDINE GLUCONATE 4 % EX LIQD: 60.0000 mL | Freq: Once | CUTANEOUS | Status: DC

## 2020-04-30 MED ORDER — HYDROCHLOROTHIAZIDE 12.5 MG PO CAPS
12.5000 mg | ORAL_CAPSULE | Freq: Every day | ORAL | Status: DC
  Administered 2020-05-01: 12.5 mg via ORAL
  Filled 2020-04-30: qty 1

## 2020-04-30 MED ORDER — DEXAMETHASONE SODIUM PHOSPHATE 10 MG/ML IJ SOLN
INTRAMUSCULAR | Status: DC | PRN
  Administered 2020-04-30: 10 mg via INTRAVENOUS

## 2020-04-30 MED ORDER — POVIDONE-IODINE 10 % EX SWAB: 2.0000 "application " | Freq: Once | CUTANEOUS | Status: DC

## 2020-04-30 MED ORDER — HYDROMORPHONE HCL 1 MG/ML IJ SOLN
INTRAMUSCULAR | Status: AC
  Administered 2020-04-30: 1 mg via INTRAVENOUS
  Filled 2020-04-30: qty 2

## 2020-04-30 MED ORDER — BISACODYL 10 MG RE SUPP: 10.0000 mg | Freq: Every day | RECTAL | Status: DC | PRN

## 2020-04-30 MED ORDER — MIDAZOLAM HCL 5 MG/5ML IJ SOLN
INTRAMUSCULAR | Status: DC | PRN
  Administered 2020-04-30: 2 mg via INTRAVENOUS

## 2020-04-30 MED ORDER — METOCLOPRAMIDE HCL 5 MG PO TABS: 5.0000 mg | ORAL_TABLET | Freq: Three times a day (TID) | ORAL | Status: DC | PRN

## 2020-04-30 MED ORDER — FENTANYL CITRATE (PF) 250 MCG/5ML IJ SOLN
INTRAMUSCULAR | Status: AC
  Filled 2020-04-30: qty 5

## 2020-04-30 MED ORDER — FERROUS SULFATE 325 (65 FE) MG PO TABS: 325.0000 mg | ORAL_TABLET | Freq: Three times a day (TID) | ORAL | Status: DC

## 2020-04-30 MED ORDER — TRANEXAMIC ACID-NACL 1000-0.7 MG/100ML-% IV SOLN
1000.0000 mg | Freq: Once | INTRAVENOUS | Status: AC
  Administered 2020-04-30: 1000 mg via INTRAVENOUS
  Filled 2020-04-30: qty 100

## 2020-04-30 MED ORDER — LACTATED RINGERS IV SOLN: INTRAVENOUS | Status: DC | PRN

## 2020-04-30 MED ORDER — MENTHOL 3 MG MT LOZG: 1.0000 | LOZENGE | OROMUCOSAL | Status: DC | PRN

## 2020-04-30 MED ORDER — MIDAZOLAM HCL 2 MG/2ML IJ SOLN
INTRAMUSCULAR | Status: AC
  Filled 2020-04-30: qty 2

## 2020-04-30 MED ORDER — METHOCARBAMOL 500 MG PO TABS
500.0000 mg | ORAL_TABLET | Freq: Four times a day (QID) | ORAL | Status: DC | PRN
  Administered 2020-05-01: 500 mg via ORAL
  Filled 2020-04-30: qty 1

## 2020-04-30 MED ORDER — HYDROMORPHONE HCL 2 MG/ML IJ SOLN
INTRAMUSCULAR | Status: AC
  Filled 2020-04-30: qty 1

## 2020-04-30 MED ORDER — PROPOFOL 500 MG/50ML IV EMUL
INTRAVENOUS | Status: DC | PRN
  Administered 2020-04-30: 100 ug/kg/min via INTRAVENOUS

## 2020-04-30 MED ORDER — FLUOXETINE HCL 20 MG PO CAPS: 20.0000 mg | ORAL_CAPSULE | Freq: Every day | ORAL | Status: DC

## 2020-04-30 MED ORDER — HYDROMORPHONE HCL 1 MG/ML IJ SOLN
0.2500 mg | INTRAMUSCULAR | Status: DC | PRN
Start: 2020-04-30 — End: 2020-04-30
  Administered 2020-04-30 (×2): 0.5 mg via INTRAVENOUS

## 2020-04-30 MED ORDER — HYDROMORPHONE HCL 1 MG/ML IJ SOLN
0.5000 mg | INTRAMUSCULAR | Status: DC | PRN
  Administered 2020-04-30: 1 mg via INTRAVENOUS
  Filled 2020-04-30 (×2): qty 1

## 2020-04-30 MED ORDER — HYDROCODONE-ACETAMINOPHEN 7.5-325 MG PO TABS
1.0000 | ORAL_TABLET | ORAL | Status: DC | PRN
  Administered 2020-05-01 (×2): 2 via ORAL
  Filled 2020-04-30 (×2): qty 2

## 2020-04-30 SURGICAL SUPPLY — 43 items
ADH SKN CLS APL DERMABOND .7 (GAUZE/BANDAGES/DRESSINGS) ×1
BAG DECANTER FOR FLEXI CONT (MISCELLANEOUS) IMPLANT
BAG SPEC THK2 15X12 ZIP CLS (MISCELLANEOUS) ×1
BAG ZIPLOCK 12X15 (MISCELLANEOUS) ×1 IMPLANT
BLADE SAG 18X100X1.27 (BLADE) ×2 IMPLANT
BLADE SURG SZ10 CARB STEEL (BLADE) ×4 IMPLANT
COVER PERINEAL POST (MISCELLANEOUS) ×2 IMPLANT
COVER SURGICAL LIGHT HANDLE (MISCELLANEOUS) ×2 IMPLANT
COVER WAND RF STERILE (DRAPES) IMPLANT
CUP ACETBLR 54 OD PINNACLE (Hips) ×1 IMPLANT
DERMABOND ADVANCED (GAUZE/BANDAGES/DRESSINGS) ×1
DERMABOND ADVANCED .7 DNX12 (GAUZE/BANDAGES/DRESSINGS) ×1 IMPLANT
DRAPE STERI IOBAN 125X83 (DRAPES) ×2 IMPLANT
DRAPE U-SHAPE 47X51 STRL (DRAPES) ×4 IMPLANT
DRESSING AQUACEL AG SP 3.5X10 (GAUZE/BANDAGES/DRESSINGS) ×1 IMPLANT
DRSG AQUACEL AG SP 3.5X10 (GAUZE/BANDAGES/DRESSINGS) ×2
DURAPREP 26ML APPLICATOR (WOUND CARE) ×2 IMPLANT
ELECT REM PT RETURN 15FT ADLT (MISCELLANEOUS) ×2 IMPLANT
GLOVE ORTHO TXT STRL SZ7.5 (GLOVE) ×4 IMPLANT
GLOVE SURG ENC MOIS LTX SZ6 (GLOVE) ×4 IMPLANT
GLOVE SURG LTX SZ8 (GLOVE) ×4 IMPLANT
GLOVE SURG UNDER POLY LF SZ6.5 (GLOVE) ×2 IMPLANT
GLOVE SURG UNDER POLY LF SZ7.5 (GLOVE) ×2 IMPLANT
GLOVE SURG UNDER POLY LF SZ8.5 (GLOVE) ×2 IMPLANT
GOWN STRL REUS W/TWL LRG LVL3 (GOWN DISPOSABLE) ×4 IMPLANT
GOWN STRL REUS W/TWL XL LVL3 (GOWN DISPOSABLE) ×2 IMPLANT
HEAD CERAMIC 36 PLUS5 (Hips) ×1 IMPLANT
HOLDER FOLEY CATH W/STRAP (MISCELLANEOUS) ×2 IMPLANT
KIT TURNOVER KIT A (KITS) ×2 IMPLANT
LINER NEUTRAL 54X36MM PLUS 4 (Hips) ×1 IMPLANT
PACK ANTERIOR HIP CUSTOM (KITS) ×2 IMPLANT
PENCIL SMOKE EVACUATOR (MISCELLANEOUS) IMPLANT
SUT MNCRL AB 3-0 PS2 18 (SUTURE) ×1 IMPLANT
SUT MNCRL AB 4-0 PS2 18 (SUTURE) ×2 IMPLANT
SUT STRATAFIX 0 PDS 27 VIOLET (SUTURE) ×2
SUT VIC AB 1 CT1 36 (SUTURE) ×6 IMPLANT
SUT VIC AB 2-0 CT1 27 (SUTURE) ×4
SUT VIC AB 2-0 CT1 TAPERPNT 27 (SUTURE) ×2 IMPLANT
SUTURE STRATFX 0 PDS 27 VIOLET (SUTURE) ×1 IMPLANT
TRAY FOLEY MTR SLVR 14FR STAT (SET/KITS/TRAYS/PACK) ×1 IMPLANT
TRAY FOLEY MTR SLVR 16FR STAT (SET/KITS/TRAYS/PACK) IMPLANT
TUBE SUCTION HIGH CAP CLEAR NV (SUCTIONS) ×4 IMPLANT
WATER STERILE IRR 1000ML POUR (IV SOLUTION) ×3 IMPLANT

## 2020-04-30 NOTE — Transfer of Care (Signed)
Immediate Anesthesia Transfer of Care Note  Patient: Taylor Gillespie  Procedure(s) Performed: TOTAL HIP ARTHROPLASTY ANTERIOR APPROACH/ EXCHANGE OF LINER,SHELL,AND FEMORAL HEAD (Left Hip)  Patient Location: Recover in OR with PACU staff  Anesthesia Type:Spinal  Level of Consciousness: drowsy and patient cooperative  Airway & Oxygen Therapy: Patient Spontanous Breathing and Patient connected to face mask oxygen  Post-op Assessment: Report given to RN and Post -op Vital signs reviewed and stable  Post vital signs: Reviewed and stable  Last Vitals:  Vitals Value Taken Time  BP    Temp    Pulse    Resp    SpO2      Last Pain:  Vitals:   04/30/20 0425  TempSrc:   PainSc: Asleep      Patients Stated Pain Goal: 0 (04/30/20 0343)  Complications: No complications documented.

## 2020-04-30 NOTE — Anesthesia Procedure Notes (Signed)
Spinal  Start time: 04/30/2020 1:21 PM End time: 04/30/2020 1:23 PM Staffing Performed: anesthesiologist  Anesthesiologist: Shelton Silvas, MD Preanesthetic Checklist Completed: patient identified, IV checked, site marked, risks and benefits discussed, surgical consent, monitors and equipment checked, pre-op evaluation and timeout performed Spinal Block Patient position: sitting Prep: DuraPrep and site prepped and draped Location: L3-4 Injection technique: single-shot Needle Needle type: Pencan  Needle gauge: 24 G Needle length: 10 cm Needle insertion depth: 10 cm Additional Notes Patient tolerated well. No immediate complications.

## 2020-04-30 NOTE — Progress Notes (Signed)
Patient ID: Taylor Gillespie, female   DOB: [birth year], 55 y.o.   MRN: [id number]  Due to positive COVID test I spoke with her and her husband regarding plans for today. We are planning on proceeding with surgery today in the COVID OR room  Consent ordered and to be completed prior to coming down for case NPO  Post op plans to follow the procedure but will plan for discharge to home tomorrow

## 2020-04-30 NOTE — Anesthesia Procedure Notes (Signed)
Date/Time: 04/30/2020 1:25 PM Performed by: Jhonnie Garner, CRNA Oxygen Delivery Method: Simple face mask

## 2020-04-30 NOTE — Anesthesia Postprocedure Evaluation (Signed)
Anesthesia Post Note  Patient: Taylor Gillespie  Procedure(s) Performed: TOTAL HIP ARTHROPLASTY ANTERIOR APPROACH/ EXCHANGE OF LINER,SHELL,AND FEMORAL HEAD (Left Hip)     Patient location during evaluation: PACU Anesthesia Type: Spinal Level of consciousness: oriented and awake and alert Pain management: pain level controlled Vital Signs Assessment: post-procedure vital signs reviewed and stable Respiratory status: spontaneous breathing, respiratory function stable and patient connected to nasal cannula oxygen Cardiovascular status: blood pressure returned to baseline and stable Postop Assessment: no headache, no backache and no apparent nausea or vomiting Anesthetic complications: no   No complications documented.  Last Vitals:  Vitals:   04/30/20 1615 04/30/20 1650  BP: 140/76 134/74  Pulse: 75 67  Resp: 18 20  Temp:  36.4 C  SpO2: 91% 99%    Last Pain:  Vitals:   04/30/20 1650  TempSrc: Oral  PainSc:                  Shelton Silvas

## 2020-04-30 NOTE — Anesthesia Preprocedure Evaluation (Addendum)
Anesthesia Evaluation  Patient identified by MRN, date of birth, ID band Patient awake    Reviewed: Allergy & Precautions, NPO status , [name] Chart, lab work & pertinent test results  Airway Mallampati: II  TM Distance: >3 FB Neck ROM: Full    Dental  (+) Teeth Intact, Dental Advisory Given   Pulmonary former smoker,    Pulmonary exam normal        Cardiovascular hypertension, Pt. on medications  Rhythm:Regular Rate:Normal     Neuro/Psych PSYCHIATRIC DISORDERS Depression    GI/Hepatic Neg liver ROS, GERD  Medicated,  Endo/Other  negative endocrine ROS  Renal/GU negative Renal ROS     Musculoskeletal  (+) Arthritis ,   Abdominal Normal abdominal exam  (+)   Peds  Hematology negative hematology ROS (+)   Anesthesia Other Findings   Reproductive/Obstetrics                            Anesthesia Physical Anesthesia Plan  ASA: II  Anesthesia Plan: Spinal   Post-op Pain Management:    Induction: Intravenous  PONV Risk Score and Plan: 3 and Propofol infusion, Midazolam and Ondansetron  Airway Management Planned:   Additional Equipment: None  Intra-op Plan:   Post-operative Plan:   Informed Consent: I have reviewed the patients History and Physical, chart, labs and discussed the procedure including the risks, benefits and alternatives for the proposed anesthesia with the patient or authorized representative who has indicated his/her understanding and acceptance.     Dental advisory given  Plan Discussed with: CRNA  Anesthesia Plan Comments: (Lab Results      Component                Value               Date                      WBC                      10.3                04/30/2020                HGB                      11.9 (L)            04/30/2020                HCT                      37.4                04/30/2020                MCV                      90.8                 04/30/2020                PLT                      297                 04/30/2020  No results found for: INR, PROTIME )       Anesthesia Quick Evaluation

## 2020-05-01 ENCOUNTER — Encounter (HOSPITAL_COMMUNITY): Payer: Self-pay | Admitting: Orthopedic Surgery

## 2020-05-01 LAB — CBC
HCT: 32.2 % — ABNORMAL LOW (ref 36.0–46.0)
Hemoglobin: 10.3 g/dL — ABNORMAL LOW (ref 12.0–15.0)
MCH: 29.1 pg (ref 26.0–34.0)
MCHC: 32 g/dL (ref 30.0–36.0)
MCV: 91 fL (ref 80.0–100.0)
Platelets: 264 10*3/uL (ref 150–400)
RBC: 3.54 MIL/uL — ABNORMAL LOW (ref 3.87–5.11)
RDW: 14.2 % (ref 11.5–15.5)
WBC: 11.5 10*3/uL — ABNORMAL HIGH (ref 4.0–10.5)
nRBC: 0 % (ref 0.0–0.2)

## 2020-05-01 LAB — BASIC METABOLIC PANEL
Anion gap: 6 (ref 5–15)
BUN: 10 mg/dL (ref 6–20)
CO2: 30 mmol/L (ref 22–32)
Calcium: 8.9 mg/dL (ref 8.9–10.3)
Chloride: 105 mmol/L (ref 98–111)
Creatinine, Ser: 0.74 mg/dL (ref 0.44–1.00)
GFR, Estimated: >60 mL/min (ref >60)
Glucose, Bld: 137 mg/dL — ABNORMAL HIGH (ref 70–99)
Potassium: 4.1 mmol/L (ref 3.5–5.1)
Sodium: 141 mmol/L (ref 135–145)

## 2020-05-01 MED ORDER — POLYETHYLENE GLYCOL 3350 17 G PO PACK: 17.0000 g | PACK | Freq: Two times a day (BID) | ORAL | 0 refills | Status: DC

## 2020-05-01 MED ORDER — METHOCARBAMOL 500 MG PO TABS: 500.0000 mg | ORAL_TABLET | Freq: Four times a day (QID) | ORAL | 0 refills | Status: DC | PRN

## 2020-05-01 MED ORDER — CEPHALEXIN 500 MG PO CAPS: 500.0000 mg | ORAL_CAPSULE | Freq: Three times a day (TID) | ORAL | 0 refills | Status: DC

## 2020-05-01 MED ORDER — DOCUSATE SODIUM 100 MG PO CAPS: 100.0000 mg | ORAL_CAPSULE | Freq: Two times a day (BID) | ORAL | 0 refills | Status: DC

## 2020-05-01 MED ORDER — HYDROCODONE-ACETAMINOPHEN 7.5-325 MG PO TABS: 1.0000 | ORAL_TABLET | ORAL | 0 refills | Status: DC | PRN

## 2020-05-01 MED ORDER — ASPIRIN 81 MG PO CHEW: 81.0000 mg | CHEWABLE_TABLET | Freq: Two times a day (BID) | ORAL | 0 refills | Status: AC

## 2020-05-01 NOTE — Discharge Summary (Signed)
Patient ID: Taylor Gillespie MRN: [id number] DOB/AGE: Mar 30, 1965 55 y.o.  Admit date: 04/29/2020 Discharge date: 05/01/2020  Admission Diagnoses:  Active Problems:   Hip instability, left   Discharge Diagnoses:  Same  Past Medical History:  Diagnosis Date  . Arthritis   . Depression   . Dizziness   . Dizziness and giddiness   . Endometriosis   . Hypertension   . Osteoarthritis   . 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 Spinal stenosis, cervical region    C5-6, C6-7    Surgeries: Procedure(s): LEFT TOTAL HIP ARTHROPLASTY ANTERIOR APPROACH/ EXCHANGE OF LINER,SHELL,AND FEMORAL HEAD on 04/30/2020   Consultants: N/A  Discharged Condition: Improved  Hospital Course: Taylor Gillespie is an 55 y.o. female who was admitted 04/29/2020 for operative treatment of left hip instability. Patient has severe unremitting pain that affects sleep, daily activities, and work/hobbies. After pre-op clearance the patient was taken to the operating room on 04/30/2020 and underwent  Procedure(s): LEFT TOTAL HIP ARTHROPLASTY ANTERIOR APPROACH/ EXCHANGE OF LINER,SHELL,AND FEMORAL HEAD.    Patient was given perioperative antibiotics:  Anti-infectives (From admission, onward)   Start     Dose/Rate Route Frequency Ordered Stop   05/01/20 0000  cephALEXin (KEFLEX) 500 MG capsule        500 mg Oral 3 times daily 05/01/20 0759     04/30/20 1900  ceFAZolin (ANCEF) IVPB 2g/100 mL premix        2 g 200 mL/hr over 30 Minutes Intravenous Every 6 hours 04/30/20 1633 05/01/20 0206   04/30/20 1030  ceFAZolin (ANCEF) 3 g in dextrose 5 % 50 mL IVPB        3 g 100 mL/hr over 30 Minutes Intravenous On call to O.R. 04/30/20 0940 04/30/20 1323       Patient was given sequential compression devices, early ambulation, and chemoprophylaxis to prevent DVT.  Patient benefited maximally from hospital stay and there were no complications.     Recent vital signs:  Patient Vitals for the past 24 hrs:  BP Temp Temp src Pulse Resp SpO2  05/01/20 0525 (!) 141/63 98 F (36.7 C) Oral (!) 56 18 99 %  04/30/20 2025 134/76 97.8 F (36.6 C) Oral 70 18 99 %  04/30/20 1650 134/74 97.6 F (36.4 C) Oral 67 20 99 %  04/30/20 1615 140/76 - - 75 18 91 %  04/30/20 1600 136/76 - - 69 20 97 %  04/30/20 1530 129/80 - - 62 18 100 %  04/30/20 1515 (!) 126/91 97.7 F (36.5 C) - 68 20 100 %     Recent laboratory studies:  Recent Labs    04/30/20 0337 05/01/20 0328  WBC 10.3 11.5*  HGB 11.9* 10.3*  HCT 37.4 32.2*  PLT 297 264  NA 142 141  K 3.4* 4.1  CL 105 105  CO2 31 30  BUN 12 10  CREATININE 0.81 0.74  GLUCOSE 112* 137*  CALCIUM 9.0 8.9     Discharge Medications:   Allergies as of 05/01/2020      Reactions   Diclofenac Other (See Comments)   Ferrous Sulfate Other (See Comments)   Morphine Nausea And Vomiting   Lisinopril Cough   Shellfish Allergy Rash   Sea food allergy. Sea food allergy.      Medication List    STOP taking these medications   acetaminophen 325 MG tablet Commonly known  as: TYLENOL   cholecalciferol 1000 units tablet Commonly known as: VITAMIN D   VITAMIN C PO     TAKE these medications   aspirin 81 MG chewable tablet Commonly known as: Aspirin Childrens Chew 1 tablet (81 mg total) by mouth 2 (two) times daily. Take for 4 weeks, then resume regular dose. Start taking on: May 02, 2020 What changed: additional instructions   cephALEXin 500 MG capsule Commonly known as: Keflex Take 1 capsule (500 mg total) by mouth 3 (three) times daily.   docusate sodium 100 MG capsule Commonly known as: Colace Take 1 capsule (100 mg total) by mouth 2 (two) times daily.   FLUoxetine 20 MG capsule Commonly known as: PROZAC Take 20 mg by mouth daily after lunch.   HYDROcodone-acetaminophen 7.5-325 MG tablet Commonly known as: Norco Take 1-2 tablets by mouth every 4 (four) hours as needed for moderate  pain.   ICY HOT EX Apply 1 application topically as needed (pain).   losartan-hydrochlorothiazide 50-12.5 MG tablet Commonly known as: HYZAAR Take 1 tablet by mouth daily.   methocarbamol 500 MG tablet Commonly known as: Robaxin Take 1 tablet (500 mg total) by mouth every 6 (six) hours as needed for muscle spasms.   pantoprazole 40 MG tablet Commonly known as: PROTONIX TAKE 1 TABLET (40 MG TOTAL) BY MOUTH DAILY. What changed: when to take this   polyethylene glycol 17 g packet Commonly known as: MIRALAX / GLYCOLAX Take 17 g by mouth 2 (two) times daily.   VITAMIN E PO Take 1 tablet by mouth daily.            Discharge Care Instructions  (From admission, onward)         Start     Ordered   05/01/20 0000  Change dressing       Comments: Maintain surgical dressing until follow up in the clinic. If the edges start to pull up, may reinforce with tape. If the dressing is no longer working, may remove and cover with gauze and tape, but must keep the area dry and clean.  Call with any questions or concerns.   05/01/20 0759          Diagnostic Studies: DG Pelvis Portable  Result Date: 04/30/2020 CLINICAL DATA:  Left hip replacement EXAM: PORTABLE PELVIS 1-2 VIEWS COMPARISON:  04/30/2020 FINDINGS: Left hip replacement in satisfactory position alignment. No fracture complication Pre-existing right hip replacement also without complication Pedicle screw fusion L5-S1. No pelvic fracture or lesion. IMPRESSION: Satisfactory left hip replacement.  No acute abnormality. Electronically Signed   By: Marlan Palau M.D.   On: 04/30/2020 17:28   DG C-Arm 1-60 Min-No Report  Result Date: 04/30/2020 Fluoroscopy was utilized by the requesting physician.  No radiographic interpretation.   DG Hip Port Unilat W or [place] Pelvis 1 View Left  Result Date: 04/29/2020 CLINICAL DATA:  Dislocated LEFT hip prosthesis post reduction EXAM: DG HIP (WITH OR WITHOUT PELVIS) 1V PORT LEFT COMPARISON:  Portable  exam 1014 hours compared to earlier study at 0741 hours FINDINGS: LEFT hip prosthesis now appears normally located on single AP view. No fracture or dislocation identified. Orthopedic hardware seen at L5-S1 and at RIGHT hip. IMPRESSION: Previously identified dislocated LEFT hip prosthesis appears reduced on single AP view. Electronically Signed   By: Ulyses Southward M.D.   On: 04/29/2020 10:35   DG HIP OPERATIVE UNILAT W OR W/O PELVIS LEFT  Result Date: 04/30/2020 CLINICAL DATA:  Left hip arthroplasty revision EXAM: OPERATIVE LEFT  HIP (WITH PELVIS IF PERFORMED) 1 VIEWS TECHNIQUE: Fluoroscopic spot image(s) were submitted for interpretation post-operatively. COMPARISON:  04/29/2020 FINDINGS: Two fluoroscopic images are obtained during the performance of the procedure and are provided for interpretation only. Images demonstrate bilateral hip arthroplasties. Intraoperative evaluation demonstrates revision of the left hip arthroplasty. Please refer to the operative report. Alignment remains anatomic. FLUOROSCOPY TIME:  8 seconds IMPRESSION: 1. Intraoperative exam as above. Please refer to the operative report. Electronically Signed   By: Sharlet Salina M.D.   On: 04/30/2020 15:23   DG Hip Unilat W or Wo Pelvis 2-3 Views Left  Result Date: 04/29/2020 CLINICAL DATA:  Dislocation. EXAM: DG HIP (WITH OR WITHOUT PELVIS) 2-3V LEFT COMPARISON:  April 27, 2020. FINDINGS: Status post bilateral total hip arthroplasties. There is noted superior and lateral dislocation of the left femoral prosthesis relative to the left acetabulum. No definite fracture is noted. IMPRESSION: Superior and lateral dislocation of left femoral prosthesis relative to the left acetabulum. Electronically Signed   By: Lupita Raider M.D.   On: 04/29/2020 07:59   DG Hip Unilat W or Wo Pelvis 2-3 Views Left  Result Date: 04/27/2020 CLINICAL DATA:  Postreduction of LEFT hip arthroplasty. EXAM: DG HIP (WITH OR WITHOUT PELVIS) 2-3V LEFT COMPARISON:   04/27/2020 FINDINGS: Patient has undergone interval reduction of LEFT hip arthroplasty. The femoral head component is located within the acetabular component. Remote lumbar fusion. IMPRESSION: Successful reduction of LEFT hip arthroplasty. Electronically Signed   By: Norva Pavlov M.D.   On: 04/27/2020 21:35   DG Hip Unilat W or Wo Pelvis 2-3 Views Left  Result Date: 04/27/2020 CLINICAL DATA:  Left hip dislocation, bilateral hip arthroplasties EXAM: DG HIP (WITH OR WITHOUT PELVIS) 2-3V LEFT COMPARISON:  None. FINDINGS: Frontal view of the pelvis as well as frontal and frogleg lateral views of the left hip are obtained. There is superolateral dislocation of the femoral component of the left hip arthroplasty. I do not see any acute displaced fractures. The right hip prosthesis remains well aligned. No acute bony abnormalities. Postsurgical changes at the lumbosacral junction. IMPRESSION: 1. Superolateral dislocation of the femoral component of the left hip arthroplasty. Electronically Signed   By: Sharlet Salina M.D.   On: 04/27/2020 19:14    Disposition: Home  Discharge Instructions    Call MD / Call 911   Complete by: As directed    If you experience chest pain or shortness of breath, CALL 911 and be transported to the hospital emergency room.  If you develope a fever above 101 F, pus (white drainage) or increased drainage or redness at the wound, or calf pain, call your surgeon's office.   Change dressing   Complete by: As directed    Maintain surgical dressing until follow up in the clinic. If the edges start to pull up, may reinforce with tape. If the dressing is no longer working, may remove and cover with gauze and tape, but must keep the area dry and clean.  Call with any questions or concerns.   Constipation Prevention   Complete by: As directed    Drink plenty of fluids.  Prune juice may be helpful.  You may use a stool softener, such as Colace (over the counter) 100 mg twice a day.  Use  MiraLax (over the counter) for constipation as needed.   Diet - low sodium heart healthy   Complete by: As directed    Discharge instructions   Complete by: As directed  Maintain surgical dressing until follow up in the clinic. If the edges start to pull up, may reinforce with tape. If the dressing is no longer working, may remove and cover with gauze and tape, but must keep the area dry and clean.  [facility]. Call with any questions or concerns.   Follow the hip precautions as taught in Physical Therapy   Complete by: As directed    Anterior hip precautions.   Increase activity slowly as tolerated   Complete by: As directed    Weight bearing as tolerated with assist device (walker, cane, etc) as directed, use it as long as suggested by your surgeon or therapist, typically at least 4-6 weeks.   TED hose   Complete by: As directed    Use stockings (TED hose) for 2 weeks on both leg(s).  You may remove them at night for sleeping.       Follow-up Information    Durene Romanslin, [place], MD. Schedule an appointment as soon as possible for a visit in 2 week(s).   Specialty: Orthopedic Surgery Contact information: [address] [place] [address] [phone]                Signed: Genelle GatherMatthew Scott [facility] 05/01/2020, 7:59 AM

## 2020-05-01 NOTE — TOC Transition Note (Signed)
Transition of Care [facility]) - CM/SW Discharge Note   Patient Details  Name: Taylor Gillespie MRN: [id number] Date of Birth: February 24, 1966  Transition of Care [facility]) CM/SW Contact:  Ida Rogue, LCSW Phone Number: 05/01/2020, 10:03 AM   Clinical Narrative:   Spoke with patient upon seeing d/c order to find out about needs.  Taylor Gillespie states she lives at home with husband, has support of multiple family members.  She was doing exercises at home post hip replacement 3 weeks ago, now feels like she may need more help with rehab, plans to contact her MD with [facility] to get set up at their rehabilitation clinic once her pain has subsided.  States she has all needed DME at home, including walker, wheelchair and shower bench.  Declines offer of bedside commode.  Ha a ride home.  No further needs identified.  TOC sign off.    Final next level of care: Home/Self Care Barriers to Discharge: No Barriers Identified   Patient Goals and CMS Choice        Discharge Placement                       Discharge Plan and Services                                     Social Determinants of Health (SDOH) Interventions     Readmission Risk Interventions No flowsheet data found.

## 2020-05-01 NOTE — Progress Notes (Signed)
Patient ID: Taylor Gillespie, female   DOB: November 26, 1965, 55 y.o.   MRN: [id number] Subjective: 1 Day Post-Op Procedure(s) (LRB): TOTAL HIP ARTHROPLASTY ANTERIOR APPROACH/ EXCHANGE OF LINER,SHELL,AND FEMORAL HEAD (Left)    Patient reports pain as mild to moderate, had some pain this am and received meds for it.  No events noted otherwise.  Objective:   VITALS:   Vitals:   04/30/20 2025 05/01/20 0525  BP: 134/76 (!) 141/63  Pulse: 70 (!) 56  Resp: 18 18  Temp: 97.8 F (36.6 C) 98 F (36.7 C)  SpO2: 99% 99%    Neurovascular intact Incision: dressing C/D/I  LABS Recent Labs    04/29/20 0730 04/30/20 0337 05/01/20 0328  HGB 11.4* 11.9* 10.3*  HCT 35.6* 37.4 32.2*  WBC 9.7 10.3 11.5*  PLT 290 297 264    Recent Labs    04/29/20 0730 04/30/20 0337 05/01/20 0328  NA 140 142 141  K 3.4* 3.4* 4.1  BUN 9 12 10   CREATININE 0.86 0.81 0.74  GLUCOSE 107* 112* 137*    No results for input(s): LABPT, INR in the last 72 hours.   Assessment/Plan: 1 Day Post-Op Procedure(s) (LRB): TOTAL HIP ARTHROPLASTY ANTERIOR APPROACH/ EXCHANGE OF LINER,SHELL,AND FEMORAL HEAD (Left)   Advance diet Up with therapy  Home today after therapy RTC in 2 weeks

## 2020-05-01 NOTE — Discharge Instructions (Signed)

## 2020-05-01 NOTE — Evaluation (Signed)
Physical Therapy Evaluation Patient Details Name: Taylor Gillespie MRN: [id number] DOB: April 20, 1965 Today's Date: 05/01/2020   History of Present Illness  Pt is 55 yo female admitted on 04/30/20 due to L hip instability s/p recent THA.  Pt is now s/p L anterior THA exchange of liner, shell, and femoral head.  Of note , pt was found to be COVID + preop. Pt with PMH of HTN, arthritis, R THA, and L THA ~ 3 weeks ago.  Clinical Impression  Pt is s/p L anterior THA revision with anterior precautions on 04/30/20 resulting in the deficits listed below (see PT Problem List). Pt with good pain control.  She was able to transfer with min A for L LE and min guard with ambulation.  Pt with good adherence to precautions and able to provide teach back.  Some limitations on evaluations due to pt on COVID precautions - unable to practice stairs; however, pt verbalized sequencing and recently performed her stairs after her initial THA.  Pt expected to progress well and safe to d/c home with family from PT perspective, but Pt will benefit from skilled PT to increase their independence and safety with mobility while hospitalized.     Follow Up Recommendations Follow surgeon's recommendation for DC plan and follow-up therapies;Supervision for mobility/OOB    Equipment Recommendations  None recommended by PT    Recommendations for Other Services       Precautions / Restrictions Precautions Precautions: Fall;Anterior Hip Precaution Booklet Issued: Yes (comment) Precaution Comments: Educated on with teach back of hip precautions Restrictions Weight Bearing Restrictions: No      Mobility  Bed Mobility Overal bed mobility: Needs Assistance Bed Mobility: Supine to Sit;Sit to Supine     Supine to sit: Min assist Sit to supine: Min assist   General bed mobility comments: Cues to exit on R side of bed to maintain hip precautions.  Required min A for L LE but otherwise did not need assist.  Pt is very fearful of  dislocation and did excellent job keeping legs midline.    Transfers Overall transfer level: Needs assistance Equipment used: Rolling walker (2 wheeled) Transfers: Sit to/from Stand Sit to Stand: Min guard         General transfer comment: min guard; cues for safe hand placement  Ambulation/Gait Ambulation/Gait assistance: Min guard Gait Distance (Feet): 80 Feet Assistive device: Rolling walker (2 wheeled) Gait Pattern/deviations: Step-to pattern;Decreased stride length Gait velocity: decreased   General Gait Details: Steady gait with RW but at slower speed.  Cues to look up when walking.  Limited to ambulation in room due to COVID precautions  Stairs Stairs:  (Unable to practice due to COVID +.  She did recently have THA (3weeks ago) and was able to do her stairs. She did reports has w/c and spouse could boost up if needed. Educated on sequencing.)          Wheelchair Mobility    Modified Rankin (Stroke Patients Only)       Balance Overall balance assessment: Needs assistance Sitting-balance support: No upper extremity supported Sitting balance-Leahy Scale: Good     Standing balance support: No upper extremity supported;Bilateral upper extremity supported Standing balance-Leahy Scale: Fair Standing balance comment: RW for ambulation; could static stand without support                             Pertinent Vitals/Pain Pain Assessment: 0-10 Pain Score: 4  Pain  Location: L hip Pain Descriptors / Indicators: Sore Pain Intervention(s): Limited activity within patient's tolerance;Monitored during session;Premedicated before session    Home Living Family/patient expects to be discharged to:: Private residence Living Arrangements: Spouse/significant other Available Help at Discharge: Family;Available 24 hours/day (possible limitations due to pt COVID +) Type of Home: House Home Access: Stairs to enter Entrance Stairs-Rails: None Entrance  Stairs-Number of Steps: 5 Home Layout: Two level;Able to live on main level with bedroom/bathroom Home Equipment: Dan Humphreys - 2 wheels;Cane - single point;Wheelchair - [employer];Shower seat      Prior Function Level of Independence: Independent   Gait / Transfers Assistance Needed: Since recent hip sx using RW and had resumed walking down steps and short community distances  ADL's / Homemaking Assistance Needed: Pt had resumed ADLs since sx  Comments: Prior to hip surgery 3 weeks ago , pt was completely independent.  Pt's hip dislocated this Monday and Wednesday thus had a revision     Hand Dominance        Extremity/Trunk Assessment   Upper Extremity Assessment Upper Extremity Assessment: Overall WFL for tasks assessed    Lower Extremity Assessment Lower Extremity Assessment: RLE deficits/detail;LLE deficits/detail RLE Deficits / Details: WFL LLE Deficits / Details: ROM: ankle and knee WFL, hip within precautions; MMT: ankle 5/5, knee 3/5 not further tested, hip 1/5    Cervical / Trunk Assessment Cervical / Trunk Assessment: Normal  Communication   Communication: No difficulties  Cognition Arousal/Alertness: Awake/alert Behavior During Therapy: WFL for tasks assessed/performed Overall Cognitive Status: Within Functional Limits for tasks assessed                                        General Comments General comments (skin integrity, edema, etc.): VSS    Exercises General Exercises - Lower Extremity Ankle Circles/Pumps: AROM;Both;10 reps;Supine Quad Sets: AROM;Both;10 reps;Supine Long Arc Quad: AROM;10 reps;Supine;Left Heel Slides: AAROM;Left;10 reps;Supine (educated gentle ROM and how to use gait belt to assist)   Assessment/Plan    PT Assessment Patient needs continued PT services  PT Problem List Decreased strength;Decreased mobility;Decreased knowledge of precautions;Decreased activity tolerance;Decreased balance;Decreased knowledge  of use of DME;Pain       PT Treatment Interventions DME instruction;Therapeutic activities;Gait training;Therapeutic exercise;Patient/family education;Modalities;Stair training;Balance training;Functional mobility training    PT Goals (Current goals can be found in the Care Plan section)  Acute Rehab PT Goals Patient Stated Goal: return home PT Goal Formulation: With patient/family Time For Goal Achievement: 05/15/20 Potential to Achieve Goals: Good    Frequency 7X/week   Barriers to discharge        Co-evaluation               AM-PAC PT "6 Clicks" Mobility  Outcome Measure Help needed turning from your back to your side while in a flat bed without using bedrails?: A Little Help needed moving from lying on your back to sitting on the side of a flat bed without using bedrails?: A Little Help needed moving to and from a bed to a chair (including a wheelchair)?: A Little Help needed standing up from a chair using your arms (e.g., wheelchair or bedside chair)?: A Little Help needed to walk in hospital room?: A Little Help needed climbing 3-5 steps with a railing? : A Little 6 Click Score: 18    End of Session Equipment Utilized During Treatment: Gait belt Activity Tolerance: Patient tolerated  treatment well Patient left: in bed;with call bell/phone within reach;with family/visitor present Nurse Communication: Mobility status PT Visit Diagnosis: Other abnormalities of gait and mobility (R26.89);Muscle weakness (generalized) (M62.81)    Time: 8786-7672 PT Time Calculation (min) (ACUTE ONLY): 40 min   Charges:   PT Evaluation $PT Eval Low Complexity: 1 Low PT Treatments $Gait Training: 8-22 mins $Therapeutic Exercise: 8-22 mins        Anise Salvo, PT Acute Rehab Services Pager [phone] [facility] [phone]    Rayetta Humphrey 05/01/2020, 11:30 AM

## 2020-05-01 NOTE — Brief Op Note (Signed)
04/30/2020  4:02 PM  PATIENT:  Taylor Gillespie  55 y.o. 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   Delay start of Pharmacological VTE agent (>24hrs) due to surgical blood loss or risk of bleeding: no

## 2020-05-02 NOTE — Op Note (Signed)
NAME: Taylor Gillespie, Taylor Gillespie MEDICAL RECORD NO: [id number] ACCOUNT NO: [id number] DATE OF BIRTH: 02-26-1966 FACILITY: [facility]ATION: [id number] PHYSICIAN: Madlyn Frankel. 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  PROCEDURE:  Revision left hip replacement.  We revised the acetabular shell to a size 54 mm Pinnacle shell with a 36+4 neutral AltrX liner and then also converted her to a 36+5 delta ceramic ball.  SURGEON:  Madlyn Frankel. Charlann Boxer, MD  ASSISTANT:  Shon Hough, PA-C.  Note that Ms. Gust Rung was present for the entirety of the case from preoperative positioning, perioperative management of the operative extremity and general facilitation of the case and primary wound closure.  ANESTHESIA:  Spinal.  SPECIMENS:  None.  COMPLICATIONS:  None identified.  BLOOD LOSS:  300 mL  DRAINS:  None.  INDICATIONS:  The patient is a 55 year old female with a history of a total hip arthroplasty on the left side 04/06/2020.  She had already had a right total hip replacement.  She also has importantly a history of a lumbosacral fusion.  Her postoperative  course was complicated by initial lower extremity radicular symptoms, which was treated with prednisone.  She was otherwise recovering well without major incident until trying to move her legs while in a supine position, she felt her hip pop out.  She  was seen in the emergency room and had it reduced.  Radiographically post-reduction, her left hip looked similar to the right.  I could not see any radiographic evidence to support a rationale for dislocation.  She was discharged with a knee immobilizer.   Two days later, she had a recurrent dislocation.  She was brought back to the emergency room.  They were able to reduce her hip, but  based on the anxiety of recurrent dislocations, I felt that it was indicated for her being admitted and planned for  surgery.  The indications were to evaluate for source of impingement and to evaluate her orientation of the components to make certain that nothing had shifted.  Risks of recurrent instability, DVT, infection were all discussed and reviewed.  The  postoperative course was based off the intraoperative findings.  DESCRIPTION OF PROCEDURE:  The patient was brought to the operative theater.  Once adequate anesthesia, preoperative antibiotics, Ancef administered as well as tranexamic acid, she was positioned safely on the Hana table.  Once carefully positioned and  padded, the left hip was prepped and draped in sterile fashion.  A timeout was performed identifying the patient, planned procedure, and extremity.  Her old incision was excised.  Soft tissue planes created.  The fascia of the tensor fascia lata, which  had healed was incised and the old sutures were removed.  The muscle was swept laterally.  We evacuated seroma from the hip.  I then exposed the hip and evaluated.  When I got to the hip, we identified that the anterior capsule that was repaired was  definitely disrupted.  I made a decision at this point to excise all the capsule that was visible anterior and posterior to prevent sources of impingement.  Once I had the anterior aspect of the hip exposed and we applied traction and then locked the leg in 20 degrees of internal rotation, I then used a bone tamp to knock off the femoral head from the trunnion.  This was done, we then dislocated the hip and removed the femoral head.  Her femoral stem was stable without loosening or shifting in  position.  With traction off the leg at 100 degrees, I was able to debride posterior capsular tissue, which could have represented a source of impingement based on the evaluation here. Based on her history of spine fusion and current recommendations of   cup position, I did at this point, plan to remove her acetabular shell and within her acetabulum, I was able to feel anterior rim of her native acetabulum indicating what would be considered to be appropriate anteversion or forward flexion.  At this  point, I removed the trial liner without difficulty.  I then removed the previously placed cancellous screw within the ilium and the hole eliminator.  I then placed onto the acetabulum the implant device and was able to remove the acetabular shell.  At  this point, I reamed again with a 53 mm reamer and then selected a 54 Pinnacle cup.  I then tried to add a little bit more anteversion to this new cup, little bit more forward flexion as well as some abduction.  Once I had this in position, we placed a  single screw into the ilium and the hole eliminator in the final liner.  At this point, I did trial reductions with a +1.5 and then a +5 ball.  I felt that her hip had more stability with a +5 ball.  Based on guidelines and recommendations related to the  spinopelvic movement, added forward flexion and abduction helped to prevent concerns for posterior instability when going from a seated to standing position based on lack of pelvic motion.  Based on these findings intraoperatively, at this point, we  debrided the scar tissue as well as some change in the position, I felt that her construct was stable.  For that reason, we selected a 36+5 delta ceramic ball, which was impacted on a clean and dry trunnion.  The hip was irrigated throughout the case and  again at this point.  There was no capsular tissue to repair.  The fascia of the tensor fascia was reapproximated using #1 Vicryl and #1 Stratafix suture.  The remainder of the wound was closed in layers using 2-0 Vicryl and a running Monocryl stitch.   The hip was then cleaned, dried and dressed sterilely using surgical glue and Aquacel dressing.  The patient was then brought to the recovery room in stable  condition, tolerating the procedure well.  Findings were reviewed with her husband.   Postoperatively, she will be weightbearing as tolerated.  We will see her back in the office in 2 weeks to assess her how she is doing clinically and reevaluate her wound.   NIK D: 05/01/2020 5:21:19 pm T: 05/02/2020 5:54:00 am  JOB: [id number]/ [id number]

## 2020-05-08 ENCOUNTER — Other Ambulatory Visit: Payer: Self-pay

## 2020-05-08 ENCOUNTER — Emergency Department (HOSPITAL_COMMUNITY): Payer: BC Managed Care – PPO

## 2020-05-08 ENCOUNTER — Emergency Department (HOSPITAL_COMMUNITY)
Admission: EM | Admit: 2020-05-08 | Discharge: 2020-05-08 | Disposition: A | Discharge status: Home / Self Care | Payer: BC Managed Care – PPO | Attending: Emergency Medicine | Admitting: Emergency Medicine

## 2020-05-08 ENCOUNTER — Encounter (HOSPITAL_COMMUNITY): Payer: Self-pay | Admitting: Emergency Medicine

## 2020-05-08 DIAGNOSIS — X501XXA Overexertion from prolonged static or awkward postures, initial encounter: Secondary | ICD-10-CM | POA: Insufficient documentation

## 2020-05-08 DIAGNOSIS — Z87891 Personal history of nicotine dependence: Secondary | ICD-10-CM | POA: Insufficient documentation

## 2020-05-08 DIAGNOSIS — I10 Essential (primary) hypertension: Secondary | ICD-10-CM | POA: Diagnosis not present

## 2020-05-08 DIAGNOSIS — M24452 Recurrent dislocation, left hip: Secondary | ICD-10-CM | POA: Diagnosis present

## 2020-05-08 DIAGNOSIS — Z79899 Other long term (current) drug therapy: Secondary | ICD-10-CM | POA: Insufficient documentation

## 2020-05-08 DIAGNOSIS — Z7982 Long term (current) use of aspirin: Secondary | ICD-10-CM | POA: Insufficient documentation

## 2020-05-08 DIAGNOSIS — Z96642 Presence of left artificial hip joint: Secondary | ICD-10-CM | POA: Diagnosis not present

## 2020-05-08 DIAGNOSIS — S73005A Unspecified dislocation of left hip, initial encounter: Secondary | ICD-10-CM

## 2020-05-08 LAB — COMPREHENSIVE METABOLIC PANEL
ALT: 22 U/L (ref 0–44)
AST: 17 U/L (ref 15–41)
Albumin: 3.3 g/dL — ABNORMAL LOW (ref 3.5–5.0)
Alkaline Phosphatase: 68 U/L (ref 38–126)
Anion gap: 11 (ref 5–15)
BUN: 10 mg/dL (ref 6–20)
CO2: 22 mmol/L (ref 22–32)
Calcium: 8.1 mg/dL — ABNORMAL LOW (ref 8.9–10.3)
Chloride: 105 mmol/L (ref 98–111)
Creatinine, Ser: 0.67 mg/dL (ref 0.44–1.00)
GFR, Estimated: >60 mL/min (ref >60)
Glucose, Bld: 110 mg/dL — ABNORMAL HIGH (ref 70–99)
Potassium: 3.2 mmol/L — ABNORMAL LOW (ref 3.5–5.1)
Sodium: 138 mmol/L (ref 135–145)
Total Bilirubin: 0.6 mg/dL (ref 0.3–1.2)
Total Protein: 6 g/dL — ABNORMAL LOW (ref 6.5–8.1)

## 2020-05-08 LAB — CBC WITH DIFFERENTIAL/PLATELET
Abs Immature Granulocytes: 0.09 10*3/uL — ABNORMAL HIGH (ref 0.00–0.07)
Basophils Absolute: 0 10*3/uL (ref 0.0–0.1)
Basophils Relative: 0 %
Eosinophils Absolute: 0.1 10*3/uL (ref 0.0–0.5)
Eosinophils Relative: 1 %
HCT: 36.7 % (ref 36.0–46.0)
Hemoglobin: 11.9 g/dL — ABNORMAL LOW (ref 12.0–15.0)
Immature Granulocytes: 1 %
Lymphocytes Relative: 34 %
Lymphs Abs: 3.7 10*3/uL (ref 0.7–4.0)
MCH: 28.7 pg (ref 26.0–34.0)
MCHC: 32.4 g/dL (ref 30.0–36.0)
MCV: 88.6 fL (ref 80.0–100.0)
Monocytes Absolute: 0.6 10*3/uL (ref 0.1–1.0)
Monocytes Relative: 5 %
Neutro Abs: 6.4 10*3/uL (ref 1.7–7.7)
Neutrophils Relative %: 59 %
Platelets: 282 10*3/uL (ref 150–400)
RBC: 4.14 MIL/uL (ref 3.87–5.11)
RDW: 14.8 % (ref 11.5–15.5)
WBC: 10.9 10*3/uL — ABNORMAL HIGH (ref 4.0–10.5)
nRBC: 0 % (ref 0.0–0.2)

## 2020-05-08 MED ORDER — PROPOFOL 10 MG/ML IV BOLUS
0.5000 mg/kg | Freq: Once | INTRAVENOUS | Status: AC
  Administered 2020-05-08: 56.7 mg via INTRAVENOUS
  Filled 2020-05-08: qty 20

## 2020-05-08 MED ORDER — PROPOFOL 10 MG/ML IV BOLUS
INTRAVENOUS | Status: AC | PRN
  Administered 2020-05-08 (×2): 56.7 mg via INTRAVENOUS

## 2020-05-08 MED ORDER — HYDROMORPHONE HCL 1 MG/ML IJ SOLN
1.0000 mg | Freq: Once | INTRAMUSCULAR | Status: AC
  Administered 2020-05-08: 1 mg via INTRAVENOUS
  Filled 2020-05-08: qty 1

## 2020-05-08 NOTE — Progress Notes (Signed)
Present for conscious sedation. Intermittent airway manipulation and 10L nasal O2 required to maintain patent airway with adequate oxygen saturations. Post procedure, O2 rapidly weaned to 2L, patient awake and oriented.

## 2020-05-08 NOTE — ED Provider Notes (Signed)
[facility]-EMERGENCY DEPT Provider Note   CSN: [id number] Arrival date & time: 05/08/20  0054     History Chief Complaint  Patient presents with  . Hip Pain    Taylor Gillespie is a 55 y.o. female.  Patient to ED with sudden onset left hip pain while repositioning supine on the couch. She has had multiple hip dislocations and reports symptoms are similar. She also reports recent surgery on 3/3 for same by Dr. Charlann Boxer. No other injury.   The history is provided by the patient. No language interpreter was used.  Hip Pain       Past Medical History:  Diagnosis Date  . Arthritis   . Depression   . Dizziness   . Dizziness and giddiness   . Endometriosis   . Hypertension   . Osteoarthritis   . 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 Spinal stenosis, cervical region    C5-6, C6-7    Patient Active Problem List   Diagnosis Date Noted  . Hip instability, left 04/29/2020  . HTN (hypertension) 04/25/2014  . Depression 04/25/2014  . Chronic back pain 04/25/2014    Past Surgical History:  Procedure Laterality Date  . BACK SURGERY  2004  . COLONOSCOPY  05/2014   gessner, repeat 5 yrs, [facility]  . JOINT REPLACEMENT    . TOTAL ABDOMINAL HYSTERECTOMY  2003  . TOTAL HIP ARTHROPLASTY Left 04/30/2020   Procedure: TOTAL HIP ARTHROPLASTY ANTERIOR APPROACH/ EXCHANGE OF Alexander Bergeron FEMORAL HEAD;  Surgeon: Durene Romans, MD;  Location: [facility];  Service: Orthopedics;  Laterality: Left;  . UPPER GI ENDOSCOPY  05/2014   [name] History   No obstetric history on file.     Family History  Problem Relation Age of Onset  . Colon cancer Father        deceased  . Diabetes Mellitus II Father   . Hypertension Father   . Diabetes Mother        deceased  . Arthritis Mother   . Hypertension Mother   . Hypertension Brother   . Hypertension Sister        two sisters  . Breast  cancer Maternal Grandmother     Social History   Tobacco Use  . Smoking status: Former Smoker    Packs/day: 0.50    Years: 30.00    Pack years: 15.00    Types: Cigarettes  . Smokeless tobacco: Never Used  Vaping Use  . Vaping Use: Never used  Substance Use Topics  . Alcohol use: Yes    Alcohol/week: 0.0 standard drinks    Comment: Occasional Beer/Wine  . Drug use: No    Home Medications Prior to Admission medications   Medication Sig Start Date End Date Taking? Authorizing Provider  aspirin (ASPIRIN CHILDRENS) 81 MG chewable tablet Chew 1 tablet (81 mg total) by mouth 2 (two) times daily. Take for 4 weeks, then resume regular dose. 05/02/20 06/01/20  Lanney Gins, PA-C  cephALEXin (KEFLEX) 500 MG capsule Take 1 capsule (500 mg total) by mouth 3 (three) times daily. 05/01/20   Lanney Gins, PA-C  docusate sodium (COLACE) 100 MG capsule Take 1 capsule (100 mg total) by mouth 2 (two) times daily. 05/01/20   Lanney Gins, PA-C  FLUoxetine (PROZAC) 20 MG capsule Take 20 mg by mouth daily after lunch.  11/10/15   [provider]  HYDROcodone-acetaminophen (NORCO) 7.5-325 MG tablet Take 1-2 tablets by mouth every 4 (four) hours as needed for moderate pain. 05/01/20   Lanney Gins, PA-C  losartan-hydrochlorothiazide (HYZAAR) 50-12.5 MG tablet Take 1 tablet by mouth daily. 04/22/20   [provider]  Menthol, Topical Analgesic, (ICY HOT EX) Apply 1 application topically as needed (pain).    [provider]  methocarbamol (ROBAXIN) 500 MG tablet Take 1 tablet (500 mg total) by mouth every 6 (six) hours as needed for muscle spasms. 05/01/20   Lanney Gins, PA-C  pantoprazole (PROTONIX) 40 MG tablet TAKE 1 TABLET (40 MG TOTAL) BY MOUTH DAILY. Patient taking differently: Take 40 mg by mouth daily after lunch. 06/01/15   Iva Boop, MD  polyethylene glycol (MIRALAX / GLYCOLAX) 17 g packet Take 17 g by mouth 2 (two) times daily. 05/01/20   Lanney Gins, PA-C   VITAMIN E PO Take 1 tablet by mouth daily.    [provider]    Allergies    Diclofenac, Ferrous sulfate, Morphine, Lisinopril, and Shellfish allergy  Review of Systems   Review of Systems  Constitutional: Negative for chills and fever.  Respiratory: Negative.   Cardiovascular: Negative.   Gastrointestinal: Negative.   Musculoskeletal:       See HPI.  Skin: Negative.  Negative for color change.  Neurological: Negative.  Negative for numbness.    Physical Exam Updated Vital Signs BP 129/84 (BP Location: Right Arm)   Pulse 77   Temp 98.1 F (36.7 C) (Oral)   Resp 19   Ht 5\' 7"  (1.702 m)   Wt 113.4 kg   LMP  (LMP Unknown)   SpO2 98%   BMI 39.16 kg/m   Physical Exam Vitals and nursing note reviewed.  Constitutional:      Appearance: She is well-developed.  Pulmonary:     Effort: Pulmonary effort is normal.  Abdominal:     Tenderness: There is no abdominal tenderness.  Musculoskeletal:     Cervical back: Normal range of motion.     Comments: Left him tender. Distinct deformity obscured by body habitus, however the left leg is rotated and shortened. Pulses present distally.   Skin:    General: Skin is warm and dry.  Neurological:     Mental Status: She is alert and oriented to person, place, and time.     ED Results / Procedures / Treatments   Labs (all labs ordered are listed, but only abnormal results are displayed) Labs Reviewed  SARS CORONAVIRUS 2 (TAT 6-24 HRS)  CBC WITH DIFFERENTIAL/PLATELET  COMPREHENSIVE METABOLIC PANEL   Results for orders placed or performed during the hospital encounter of 05/08/20  CBC with Differential  Result Value Ref Range   WBC 10.9 (H) 4.0 - 10.5 K/uL   RBC 4.14 3.87 - 5.11 MIL/uL   Hemoglobin 11.9 (L) 12.0 - 15.0 g/dL   HCT 07/08/20 07.6 - 80.8 %   MCV 88.6 80.0 - 100.0 fL   MCH 28.7 26.0 - 34.0 pg   MCHC 32.4 30.0 - 36.0 g/dL   RDW 81.1 03.1 - 59.4 %   Platelets 282 150 - 400 K/uL   nRBC 0.0 0.0 - 0.2 %    Neutrophils Relative % 59 %   Neutro Abs 6.4 1.7 - 7.7 K/uL   Lymphocytes Relative 34 %   Lymphs Abs 3.7 0.7 - 4.0 K/uL   Monocytes Relative 5 %   Monocytes Absolute 0.6 0.1 - 1.0 K/uL   Eosinophils Relative 1 %  Eosinophils Absolute 0.1 0.0 - 0.5 K/uL   Basophils Relative 0 %   Basophils Absolute 0.0 0.0 - 0.1 K/uL   Immature Granulocytes 1 %   Abs Immature Granulocytes 0.09 (H) 0.00 - 0.07 K/uL  Comprehensive metabolic panel  Result Value Ref Range   Sodium 138 135 - 145 mmol/L   Potassium 3.2 (L) 3.5 - 5.1 mmol/L   Chloride 105 98 - 111 mmol/L   CO2 22 22 - 32 mmol/L   Glucose, Bld 110 (H) 70 - 99 mg/dL   BUN 10 6 - 20 mg/dL   Creatinine, Ser 0.98 0.44 - 1.00 mg/dL   Calcium 8.1 (L) 8.9 - 10.3 mg/dL   Total Protein 6.0 (L) 6.5 - 8.1 g/dL   Albumin 3.3 (L) 3.5 - 5.0 g/dL   AST 17 15 - 41 U/L   ALT 22 0 - 44 U/L   Alkaline Phosphatase 68 38 - 126 U/L   Total Bilirubin 0.6 0.3 - 1.2 mg/dL   GFR, Estimated >11 >91 mL/min   Anion gap 11 5 - 15   DG Pelvis Portable  Result Date: 04/30/2020 CLINICAL DATA:  Left hip replacement EXAM: PORTABLE PELVIS 1-2 VIEWS COMPARISON:  04/30/2020 FINDINGS: Left hip replacement in satisfactory position alignment. No fracture complication Pre-existing right hip replacement also without complication Pedicle screw fusion L5-S1. No pelvic fracture or lesion. IMPRESSION: Satisfactory left hip replacement.  No acute abnormality. Electronically Signed   By: Marlan Palau M.D.   On: 04/30/2020 17:28   DG C-Arm 1-60 Min-No Report  Result Date: 04/30/2020 Fluoroscopy was utilized by the requesting physician.  No radiographic interpretation.   DG Hip Unilat W or Wo Pelvis 1 View Left  Result Date: 05/08/2020 CLINICAL DATA:  Suspect dislocation EXAM: DG HIP (WITH OR WITHOUT PELVIS) 1V*L* COMPARISON:  Radiographs 04/30/2020, 04/29/2020 FINDINGS: Superolateral dislocation of the left hip arthroplasty. No periprosthetic fracture or other acute complication.  Slight external rotation of the femur noted. Remaining bones of the pelvis are intact and congruent with some mild degenerative changes in the SI joint and pubic symphysis. Right total hip arthroplasty noted as well without acute complication. Partial visualization of a lumbar spine fusion, incompletely assessed on this exam. Left lateral hip swelling. No other acute or worrisome soft tissue abnormalities. IMPRESSION: Superolateral dislocation of the left hip arthroplasty with slight external rotation. Electronically Signed   By: Kreg Shropshire M.D.   On: 05/08/2020 02:18   DG Hip Port Unilat W or Wo Pelvis 1 View Left  Result Date: 04/29/2020 CLINICAL DATA:  Dislocated LEFT hip prosthesis post reduction EXAM: DG HIP (WITH OR WITHOUT PELVIS) 1V PORT LEFT COMPARISON:  Portable exam 1014 hours compared to earlier study at 0741 hours FINDINGS: LEFT hip prosthesis now appears normally located on single AP view. No fracture or dislocation identified. Orthopedic hardware seen at L5-S1 and at RIGHT hip. IMPRESSION: Previously identified dislocated LEFT hip prosthesis appears reduced on single AP view. Electronically Signed   By: Ulyses Southward M.D.   On: 04/29/2020 10:35   DG HIP OPERATIVE UNILAT W OR W/O PELVIS LEFT  Result Date: 04/30/2020 CLINICAL DATA:  Left hip arthroplasty revision EXAM: OPERATIVE LEFT HIP (WITH PELVIS IF PERFORMED) 1 VIEWS TECHNIQUE: Fluoroscopic spot image(s) were submitted for interpretation post-operatively. COMPARISON:  04/29/2020 FINDINGS: Two fluoroscopic images are obtained during the performance of the procedure and are provided for interpretation only. Images demonstrate bilateral hip arthroplasties. Intraoperative evaluation demonstrates revision of the left hip arthroplasty. Please refer to the  operative report. Alignment remains anatomic. FLUOROSCOPY TIME:  8 seconds IMPRESSION: 1. Intraoperative exam as above. Please refer to the operative report. Electronically Signed   By: Sharlet Salina M.D.   On: 04/30/2020 15:23   DG Hip Unilat W or Wo Pelvis 2-3 Views Left  Result Date: 04/29/2020 CLINICAL DATA:  Dislocation. EXAM: DG HIP (WITH OR WITHOUT PELVIS) 2-3V LEFT COMPARISON:  April 27, 2020. FINDINGS: Status post bilateral total hip arthroplasties. There is noted superior and lateral dislocation of the left femoral prosthesis relative to the left acetabulum. No definite fracture is noted. IMPRESSION: Superior and lateral dislocation of left femoral prosthesis relative to the left acetabulum. Electronically Signed   By: Lupita Raider M.D.   On: 04/29/2020 07:59   DG Hip Unilat W or Wo Pelvis 2-3 Views Left  Result Date: 04/27/2020 CLINICAL DATA:  Postreduction of LEFT hip arthroplasty. EXAM: DG HIP (WITH OR WITHOUT PELVIS) 2-3V LEFT COMPARISON:  04/27/2020 FINDINGS: Patient has undergone interval reduction of LEFT hip arthroplasty. The femoral head component is located within the acetabular component. Remote lumbar fusion. IMPRESSION: Successful reduction of LEFT hip arthroplasty. Electronically Signed   By: Norva Pavlov M.D.   On: 04/27/2020 21:35   DG Hip Unilat W or Wo Pelvis 2-3 Views Left  Result Date: 04/27/2020 CLINICAL DATA:  Left hip dislocation, bilateral hip arthroplasties EXAM: DG HIP (WITH OR WITHOUT PELVIS) 2-3V LEFT COMPARISON:  None. FINDINGS: Frontal view of the pelvis as well as frontal and frogleg lateral views of the left hip are obtained. There is superolateral dislocation of the femoral component of the left hip arthroplasty. I do not see any acute displaced fractures. The right hip prosthesis remains well aligned. No acute bony abnormalities. Postsurgical changes at the lumbosacral junction. IMPRESSION: 1. Superolateral dislocation of the femoral component of the left hip arthroplasty. Electronically Signed   By: Sharlet Salina M.D.   On: 04/27/2020 19:14    EKG None  Radiology No results found.  Procedures Reduction of dislocation  Date/Time:  05/08/2020 4:28 AM Performed by: Elpidio Anis, PA-C Authorized by: Elpidio Anis, PA-C  Consent: Verbal consent obtained. Risks and benefits: risks, benefits and alternatives were discussed Consent given by: patient Patient understanding: patient states understanding of the procedure being performed Patient consent: the patient's understanding of the procedure matches consent given Procedure consent: procedure consent matches procedure scheduled Test results: test results available and properly labeled Imaging studies: imaging studies available Patient identity confirmed: verbally with patient Time out: Immediately prior to procedure a "time out" was called to verify the correct patient, procedure, equipment, support staff and site/side marked as required. Local anesthesia used: no  Anesthesia: Local anesthesia used: no  Sedation: Patient sedated: yes Sedation type: moderate (conscious) sedation Sedatives: propofol Vitals: Vital signs were monitored during sedation.  Comments: Left hip successfully reduced using superior traction without difficulty.     CRITICAL CARE Performed by: Arnoldo Hooker   Total critical care time: 40 minutes  Critical care time was exclusive of separately billable procedures and treating other patients.  Critical care was necessary to treat or prevent imminent or life-threatening deterioration.  Critical care was time spent personally by me on the following activities: development of treatment plan with patient and/or surrogate as well as nursing, discussions with consultants, evaluation of patient's response to treatment, examination of patient, obtaining history from patient or surrogate, ordering and performing treatments and interventions, ordering and review of laboratory studies, ordering and review of radiographic studies, pulse  oximetry and re-evaluation of patient's condition.   Medications Ordered in ED Medications  HYDROmorphone  (DILAUDID) injection 1 mg (has no administration in time range)    ED Course  I have reviewed the triage vital signs and the nursing notes.  Pertinent labs & imaging results that were available during my care of the patient were reviewed by me and considered in my medical decision making (see chart for details).    MDM Rules/Calculators/A&P                          Patient to ED with sudden onset left hip pain after changing her position on the couch. C/W previous hip dislocations. Confirmed to be a superolateral dislocation. Neurovascularly intact.   [name] pain was addressed. VSS. Dr. Aundria Rudogers (oncall for Dr. Charlann Boxerlin) consulted due to recent surgery, who advised we could attempt reduction in the ED.   The patient was sedated using propofol under the supervision of Dr. Manus Gunningancour. The joint was successfully reduced without difficulty and confirmed with post-reduction film.   Knee immobilizer applied. When recovered from sedation, the patient's husband is here for safe transportation home.   5:30 - patient is awake and alert. She will follow up with Dr. Charlann Boxerlin by calling the office later today for any change in care plan.    Final Clinical Impression(s) / ED Diagnoses Final diagnoses:  None   1. Recurrent left hip dislocation  Rx / DC Orders ED Discharge Orders    None       Elpidio AnisUpstill, Shari, PA-C 05/08/20 0544    Glynn Octaveancour, Stephen, MD 05/08/20 [phone]

## 2020-05-08 NOTE — ED Triage Notes (Signed)
Pt from home Hx recent hip replacement and tonight while repositoning self on couch hip popped out. Hx of same 1 week ago when hip was revised

## 2020-05-08 NOTE — ED Provider Notes (Signed)
   Sedation performed for hip dislocation. Physical Exam  BP (!) 150/72   Pulse 79   Temp 98 F (36.7 C)   Resp 18   Ht 5\' 7"  (1.702 m)   Wt 113.4 kg   LMP  (LMP Unknown)   SpO2 100%   BMI 39.16 kg/m    ED Course/Procedures    LLE shortened and externally rotated. Intact DP pulse. .Sedation  Date/Time: 05/08/2020 4:27 AM Performed by: 07/08/2020, MD Authorized by: Glynn Octave, MD   Consent:    Consent obtained:  Verbal   Consent given by:  Patient   Risks discussed:  Allergic reaction, dysrhythmia, inadequate sedation, nausea, prolonged hypoxia resulting in organ damage, prolonged sedation necessitating reversal, respiratory compromise necessitating ventilatory assistance and intubation and vomiting   Alternatives discussed:  Analgesia without sedation, anxiolysis and regional anesthesia Universal protocol:    Procedure explained and questions answered to patient or proxy's satisfaction: yes     Relevant documents present and verified: yes     Test results available: yes     Imaging studies available: yes     Required blood products, implants, devices, and special equipment available: yes     Site/side marked: yes     Immediately prior to procedure, a time out was called: yes     Patient identity confirmed:  Verbally with patient and anonymous protocol, patient vented/unresponsive Indications:    Procedure necessitating sedation performed by:  Physician performing sedation Pre-sedation assessment:    Time since last food or drink:  8   ASA classification: class 1 - normal, healthy patient     Mouth opening:  3 or more finger widths   Thyromental distance:  4 finger widths   Mallampati score:  I - soft palate, uvula, fauces, pillars visible   Neck mobility: normal     Pre-sedation assessments completed and reviewed: airway patency, cardiovascular function, hydration status, mental status, nausea/vomiting, pain level, respiratory function and temperature      Pre-sedation assessment completed:  05/08/2020 4:12 AM Immediate pre-procedure details:    Reassessment: Patient reassessed immediately prior to procedure     Reviewed: vital signs, relevant labs/tests and NPO status     Verified: bag valve mask available, emergency equipment available, intubation equipment available, IV patency confirmed, oxygen available and suction available   Procedure details (see MAR for exact dosages):    Preoxygenation:  Nasal cannula   Sedation:  Propofol   Intended level of sedation: deep   Analgesia:  Hydromorphone   Intra-procedure monitoring:  Blood pressure monitoring, cardiac monitor, continuous pulse oximetry, frequent LOC assessments, frequent vital sign checks and continuous capnometry   Intra-procedure events: none     Total Provider sedation time (minutes):  15 Post-procedure details:    Post-sedation assessment completed:  05/08/2020 4:27 AM   Attendance: Constant attendance by certified staff until patient recovered     Recovery: Patient returned to pre-procedure baseline     Post-sedation assessments completed and reviewed: airway patency, cardiovascular function, hydration status, mental status, nausea/vomiting, pain level, respiratory function and temperature     Patient is stable for discharge or admission: yes     Procedure completion:  Tolerated well, no immediate complications    MDM  Sedation provided for reduction of left hip dislocation. Postreduction x-ray shows relocation successful.       07/08/2020, MD 05/08/20 [phone]

## 2020-05-08 NOTE — Discharge Instructions (Addendum)
Call Dr. Charlann Boxer to discuss any change in care plan given recurrent dislocation tonight.   Continue your current medications for comfort. Return to the emergency department as needed.

## 2020-10-07 ENCOUNTER — Other Ambulatory Visit: Payer: Self-pay | Admitting: Neurosurgery

## 2020-10-07 DIAGNOSIS — M5440 Lumbago with sciatica, unspecified side: Secondary | ICD-10-CM

## 2020-10-07 DIAGNOSIS — M542 Cervicalgia: Secondary | ICD-10-CM

## 2020-10-17 ENCOUNTER — Ambulatory Visit
Admission: RE | Admit: 2020-10-17 | Discharge: 2020-10-17 | Disposition: A | Discharge status: Home / Self Care | Payer: BC Managed Care – PPO | Source: Ambulatory Visit | Attending: Neurosurgery | Admitting: Neurosurgery

## 2020-10-17 DIAGNOSIS — M5440 Lumbago with sciatica, unspecified side: Secondary | ICD-10-CM

## 2020-10-17 DIAGNOSIS — M542 Cervicalgia: Secondary | ICD-10-CM

## 2020-11-16 ENCOUNTER — Other Ambulatory Visit: Payer: Self-pay | Admitting: Neurosurgery

## 2020-12-21 NOTE — Pre-Procedure Instructions (Signed)
Surgical Instructions    Your procedure is scheduled on Wednesday 12/30/20.   Report to [facility] Main Entrance "A" at 06:30 A.M., then check in with the Admitting office.  Call this number if you have problems the morning of surgery:  [phone]   If you have any questions prior to your surgery date call [phone]: Open Monday-Friday 8am-4pm    Remember:  Do not eat or drink after midnight the night before your surgery    Take these medicines the morning of surgery with A SIP OF WATER   FLUoxetine (PROZAC)   pantoprazole (PROTONIX)   acetaminophen (TYLENOL)- If needed      As of today, STOP taking any Aspirin (unless otherwise instructed by your surgeon) Aleve, Naproxen, Ibuprofen, Motrin, Advil, Goody's, BC's, all herbal medications, fish oil, and all vitamins.     After your COVID test   You are not required to quarantine however you are required to wear a well-fitting mask when you are out and around people not in your household.  If your mask becomes wet or soiled, replace with a new one.  Wash your hands often with soap and water for 20 seconds or clean your hands with an alcohol-based hand sanitizer that contains at least 60% alcohol.  Do not share personal items.  Notify your provider: if you are in close contact with someone who has COVID  or if you develop a fever of 100.4 or greater, sneezing, cough, sore throat, shortness of breath or body aches.             Do not wear jewelry or makeup Do not wear lotions, powders, perfumes/colognes, or deodorant. Do not shave 48 hours prior to surgery.  Men may shave face and neck. Do not bring valuables to the hospital. DO Not wear nail polish, gel polish, artificial nails, or any other type of covering on natural nails including finger and toenails. If patients have artificial nails, gel coating, etc. that need to be removed by a nail salon, please have this removed prior to surgery or surgery may need to be  canceled/delayed if the surgeon/ anesthesia feels like the patient is unable to be adequately monitored.              is not responsible for any belongings or valuables.  Do NOT Smoke (Tobacco/Vaping)  24 hours prior to your procedure. Do NOT drink alcohol 24 hours prior to your procedure.   If you use a CPAP at night, you may bring your mask for your overnight stay.   Contacts, glasses, hearing aids, dentures or partials may not be worn into surgery, please bring cases for these belongings   For patients admitted to the hospital, discharge time will be determined by your treatment team.   Patients discharged the day of surgery will not be allowed to drive home, and someone needs to stay with them for 24 hours.  NO VISITORS WILL BE ALLOWED IN PRE-OP WHERE PATIENTS ARE PREPPED FOR SURGERY.  ONLY 1 SUPPORT PERSON MAY BE PRESENT IN THE WAITING ROOM WHILE YOU ARE IN SURGERY.  IF YOU ARE TO BE ADMITTED, ONCE YOU ARE IN YOUR ROOM YOU WILL BE ALLOWED TWO (2) VISITORS. 1 (ONE) VISITOR MAY STAY OVERNIGHT BUT MUST ARRIVE TO THE ROOM BY 8pm.  Minor children may have two parents present. Special consideration for safety and communication needs will be reviewed on a case by case basis.  Special instructions:    Oral Hygiene is also important  to reduce your risk of infection.  Remember - BRUSH YOUR TEETH THE MORNING OF SURGERY WITH YOUR REGULAR TOOTHPASTE   [facility]- Preparing For Surgery  Before surgery, you can play an important role. Because skin is not sterile, your skin needs to be as free of germs as possible. You can reduce the number of germs on your skin by washing with CHG (chlorahexidine gluconate) Soap before surgery.  CHG is an antiseptic cleaner which kills germs and bonds with the skin to continue killing germs even after washing.     Please do not use if you have an allergy to CHG or antibacterial soaps. If your skin becomes reddened/irritated stop using the CHG.  Do not  shave (including legs and underarms) for at least 48 hours prior to first CHG shower. It is OK to shave your face.  Please follow these instructions carefully.     Shower the NIGHT BEFORE SURGERY and the MORNING OF SURGERY with CHG Soap.   If you chose to wash your hair, wash your hair first as usual with your normal shampoo. After you shampoo, rinse your hair and body thoroughly to remove the shampoo.  Then [employer] and genitals (private parts) with your normal soap and rinse thoroughly to remove soap.  After that Use CHG Soap as you would any other liquid soap. You can apply CHG directly to the skin and wash gently with a scrungie or a clean washcloth.   Apply the CHG Soap to your body ONLY FROM THE NECK DOWN.  Do not use on open wounds or open sores. Avoid contact with your eyes, ears, mouth and genitals (private parts). Wash Face and genitals (private parts)  with your normal soap.   Wash thoroughly, paying special attention to the area where your surgery will be performed.  Thoroughly rinse your body with warm water from the neck down.  DO NOT shower/wash with your normal soap after using and rinsing off the CHG Soap.  Pat yourself dry with a CLEAN TOWEL.  Wear CLEAN PAJAMAS to bed the night before surgery  Place CLEAN SHEETS on your bed the night before your surgery  DO NOT SLEEP WITH PETS.   Day of Surgery:  Take a shower with CHG soap. Wear Clean/Comfortable clothing the morning of surgery Do not apply any deodorants/lotions.   Remember to brush your teeth WITH YOUR REGULAR TOOTHPASTE.   Please read over the following fact sheets that you were given.

## 2020-12-22 ENCOUNTER — Encounter (HOSPITAL_COMMUNITY)
Admission: RE | Admit: 2020-12-22 | Discharge: 2020-12-22 | Disposition: A | Discharge status: Home / Self Care | Payer: BC Managed Care – PPO | Source: Ambulatory Visit | Attending: Neurosurgery | Admitting: Neurosurgery

## 2020-12-22 ENCOUNTER — Encounter (HOSPITAL_COMMUNITY): Payer: Self-pay

## 2020-12-22 ENCOUNTER — Other Ambulatory Visit: Payer: Self-pay

## 2020-12-22 VITALS — BP 150/81 | HR 91 | Temp 98.4°F | Resp 18 | Ht 67.0 in | Wt 249.5 lb

## 2020-12-22 DIAGNOSIS — I10 Essential (primary) hypertension: Secondary | ICD-10-CM | POA: Insufficient documentation

## 2020-12-22 DIAGNOSIS — Z01818 Encounter for other preprocedural examination: Secondary | ICD-10-CM

## 2020-12-22 DIAGNOSIS — Z01812 Encounter for preprocedural laboratory examination: Secondary | ICD-10-CM | POA: Insufficient documentation

## 2020-12-22 HISTORY — DX: Anxiety disorder, unspecified: F41.9

## 2020-12-22 LAB — CBC
HCT: 43.6 % (ref 36.0–46.0)
Hemoglobin: 14.5 g/dL (ref 12.0–15.0)
MCH: 28.9 pg (ref 26.0–34.0)
MCHC: 33.3 g/dL (ref 30.0–36.0)
MCV: 87 fL (ref 80.0–100.0)
Platelets: 308 10*3/uL (ref 150–400)
RBC: 5.01 MIL/uL (ref 3.87–5.11)
RDW: 14.6 % (ref 11.5–15.5)
WBC: 10.1 10*3/uL (ref 4.0–10.5)
nRBC: 0 % (ref 0.0–0.2)

## 2020-12-22 LAB — BASIC METABOLIC PANEL
Anion gap: 9 (ref 5–15)
BUN: 7 mg/dL (ref 6–20)
CO2: 28 mmol/L (ref 22–32)
Calcium: 9.3 mg/dL (ref 8.9–10.3)
Chloride: 100 mmol/L (ref 98–111)
Creatinine, Ser: 0.82 mg/dL (ref 0.44–1.00)
GFR, Estimated: >60 mL/min (ref >60)
Glucose, Bld: 103 mg/dL — ABNORMAL HIGH (ref 70–99)
Potassium: 3.5 mmol/L (ref 3.5–5.1)
Sodium: 137 mmol/L (ref 135–145)

## 2020-12-22 LAB — SURGICAL PCR SCREEN
MRSA, PCR: NEGATIVE
Staphylococcus aureus: NEGATIVE

## 2020-12-22 LAB — TYPE AND SCREEN
ABO/RH(D): O POS
Antibody Screen: NEGATIVE

## 2020-12-22 NOTE — Progress Notes (Signed)
PCP - Lindaann Pascal, [facility] Cardiologist - denies  PPM/ICD - n/a Device Orders - n/a Rep Notified - n/a  Chest x-ray - denies EKG - 04/29/20 Stress Test - denies ECHO - denies Cardiac Cath - denies  Sleep Study - denies CPAP - n/a  Fasting Blood Sugar - n/a Checks Blood Sugar _____ times a day- n/a  Blood Thinner Instructions: n/a Aspirin Instructions: n/a  ERAS Protcol - No PRE-SURGERY Ensure or G2- n/a  COVID TEST- Scheduled for 12/28/20. Patient is aware of date, time, and location.    Anesthesia review: No  Patient denies shortness of breath, fever, cough and chest pain at PAT appointment   All instructions explained to the patient, with a verbal understanding of the material. Patient agrees to go over the instructions while at home for a better understanding. Patient also instructed to self quarantine after being tested for COVID-19. The opportunity to ask questions was provided.

## 2020-12-28 ENCOUNTER — Other Ambulatory Visit (HOSPITAL_COMMUNITY)
Admission: RE | Admit: 2020-12-28 | Discharge: 2020-12-28 | Disposition: A | Discharge status: Home / Self Care | Payer: BC Managed Care – PPO | Source: Ambulatory Visit | Attending: Neurosurgery | Admitting: Neurosurgery

## 2020-12-28 DIAGNOSIS — Z20822 Contact with and (suspected) exposure to covid-19: Secondary | ICD-10-CM | POA: Insufficient documentation

## 2020-12-28 DIAGNOSIS — Z01812 Encounter for preprocedural laboratory examination: Secondary | ICD-10-CM | POA: Insufficient documentation

## 2020-12-28 DIAGNOSIS — Z01818 Encounter for other preprocedural examination: Secondary | ICD-10-CM

## 2020-12-28 LAB — SARS CORONAVIRUS 2 (TAT 6-24 HRS): SARS Coronavirus 2: NEGATIVE

## 2020-12-29 NOTE — Anesthesia Preprocedure Evaluation (Addendum)
Anesthesia Evaluation  Patient identified by MRN, date of birth, ID band Patient awake    Reviewed: Allergy & Precautions, NPO status , [name] Chart, lab work & pertinent test results  Airway Mallampati: II  TM Distance: >3 FB Neck ROM: Limited    Dental  (+) Partial Upper   Pulmonary neg pulmonary ROS, former smoker,    Pulmonary exam normal        Cardiovascular hypertension, Pt. on medications  Rhythm:Regular Rate:Normal     Neuro/Psych Anxiety Depression negative neurological ROS     GI/Hepatic Neg liver ROS, GERD  Medicated,  Endo/Other  negative endocrine ROS  Renal/GU negative Renal ROS  negative genitourinary   Musculoskeletal  (+) Arthritis , Osteoarthritis,  Cervical stenosis   Abdominal (+)  Abdomen: soft.    Peds  Hematology negative hematology ROS (+)   Anesthesia Other Findings   Reproductive/Obstetrics                            Anesthesia Physical Anesthesia Plan  ASA: 2  Anesthesia Plan: General   Post-op Pain Management:    Induction: Intravenous  PONV Risk Score and Plan: 3 and Ondansetron, Dexamethasone, Midazolam and Treatment may vary due to age or medical condition  Airway Management Planned: Mask and Oral ETT  Additional Equipment: None  Intra-op Plan:   Post-operative Plan: Extubation in OR  Informed Consent: I have reviewed the patients History and Physical, chart, labs and discussed the procedure including the risks, benefits and alternatives for the proposed anesthesia with the patient or authorized representative who has indicated his/her understanding and acceptance.     Dental advisory given  Plan Discussed with: CRNA  Anesthesia Plan Comments: (Lab Results      Component                Value               Date                      WBC                      10.1                12/22/2020                HGB                      14.5                 12/22/2020                HCT                      43.6                12/22/2020                MCV                      87.0                12/22/2020                PLT                      308  12/22/2020           Lab Results      Component                Value               Date                      NA                       137                 12/22/2020                K                        3.5                 12/22/2020                CO2                      28                  12/22/2020                GLUCOSE                  103 (H)             12/22/2020                BUN                      7                   12/22/2020                CREATININE               0.82                12/22/2020                CALCIUM                  9.3                 12/22/2020                GFRNONAA                 >60                 12/22/2020          )       Anesthesia Quick Evaluation

## 2020-12-30 ENCOUNTER — Inpatient Hospital Stay (HOSPITAL_COMMUNITY): Payer: BC Managed Care – PPO

## 2020-12-30 ENCOUNTER — Inpatient Hospital Stay (HOSPITAL_COMMUNITY): Payer: BC Managed Care – PPO | Admitting: Anesthesiology

## 2020-12-30 ENCOUNTER — Other Ambulatory Visit: Payer: Self-pay

## 2020-12-30 ENCOUNTER — Encounter (HOSPITAL_COMMUNITY)
Admission: RE | Disposition: A | Discharge status: Home / Self Care | Payer: Self-pay | Source: Home / Self Care | Attending: Neurosurgery

## 2020-12-30 ENCOUNTER — Inpatient Hospital Stay (HOSPITAL_COMMUNITY)
Admission: RE | Admit: 2020-12-30 | Discharge: 2020-12-31 | DRG: 473 | Disposition: A | Discharge status: Home / Self Care | Payer: BC Managed Care – PPO | Attending: Neurosurgery | Admitting: Neurosurgery

## 2020-12-30 DIAGNOSIS — Z8249 Family history of ischemic heart disease and other diseases of the circulatory system: Secondary | ICD-10-CM

## 2020-12-30 DIAGNOSIS — Z8261 Family history of arthritis: Secondary | ICD-10-CM | POA: Diagnosis not present

## 2020-12-30 DIAGNOSIS — Z96642 Presence of left artificial hip joint: Secondary | ICD-10-CM | POA: Diagnosis present

## 2020-12-30 DIAGNOSIS — Z888 Allergy status to other drugs, medicaments and biological substances status: Secondary | ICD-10-CM | POA: Diagnosis not present

## 2020-12-30 DIAGNOSIS — F32A Depression, unspecified: Secondary | ICD-10-CM | POA: Diagnosis present

## 2020-12-30 DIAGNOSIS — Z90710 Acquired absence of both cervix and uterus: Secondary | ICD-10-CM | POA: Diagnosis not present

## 2020-12-30 DIAGNOSIS — M4802 Spinal stenosis, cervical region: Secondary | ICD-10-CM | POA: Diagnosis present

## 2020-12-30 DIAGNOSIS — M4712 Other spondylosis with myelopathy, cervical region: Secondary | ICD-10-CM | POA: Diagnosis present

## 2020-12-30 DIAGNOSIS — Z91013 Allergy to seafood: Secondary | ICD-10-CM | POA: Diagnosis not present

## 2020-12-30 DIAGNOSIS — Z886 Allergy status to analgesic agent status: Secondary | ICD-10-CM

## 2020-12-30 DIAGNOSIS — Z419 Encounter for procedure for purposes other than remedying health state, unspecified: Secondary | ICD-10-CM

## 2020-12-30 DIAGNOSIS — F419 Anxiety disorder, unspecified: Secondary | ICD-10-CM | POA: Diagnosis present

## 2020-12-30 DIAGNOSIS — Z20822 Contact with and (suspected) exposure to covid-19: Secondary | ICD-10-CM | POA: Diagnosis present

## 2020-12-30 DIAGNOSIS — Z79899 Other long term (current) drug therapy: Secondary | ICD-10-CM | POA: Diagnosis not present

## 2020-12-30 DIAGNOSIS — Z87891 Personal history of nicotine dependence: Secondary | ICD-10-CM | POA: Diagnosis not present

## 2020-12-30 DIAGNOSIS — I10 Essential (primary) hypertension: Secondary | ICD-10-CM | POA: Diagnosis present

## 2020-12-30 DIAGNOSIS — M1990 Unspecified osteoarthritis, unspecified site: Secondary | ICD-10-CM | POA: Diagnosis present

## 2020-12-30 DIAGNOSIS — M4722 Other spondylosis with radiculopathy, cervical region: Secondary | ICD-10-CM | POA: Diagnosis present

## 2020-12-30 DIAGNOSIS — M542 Cervicalgia: Secondary | ICD-10-CM | POA: Diagnosis present

## 2020-12-30 DIAGNOSIS — Z885 Allergy status to narcotic agent status: Secondary | ICD-10-CM | POA: Diagnosis not present

## 2020-12-30 HISTORY — PX: ANTERIOR CERVICAL DECOMP/DISCECTOMY FUSION: SHX1161

## 2020-12-30 SURGERY — ANTERIOR CERVICAL DECOMPRESSION/DISCECTOMY FUSION 3 LEVELS
Anesthesia: General

## 2020-12-30 MED ORDER — FENTANYL CITRATE (PF) 100 MCG/2ML IJ SOLN
INTRAMUSCULAR | Status: AC
  Filled 2020-12-30: qty 2

## 2020-12-30 MED ORDER — CHLORHEXIDINE GLUCONATE 0.12 % MT SOLN
15.0000 mL | Freq: Once | OROMUCOSAL | Status: AC
  Administered 2020-12-30: 15 mL via OROMUCOSAL
  Filled 2020-12-30: qty 15

## 2020-12-30 MED ORDER — LOSARTAN POTASSIUM 50 MG PO TABS: 50.0000 mg | ORAL_TABLET | Freq: Every day | ORAL | Status: DC

## 2020-12-30 MED ORDER — HYDROMORPHONE HCL 1 MG/ML IJ SOLN
0.5000 mg | INTRAMUSCULAR | Status: DC | PRN
  Administered 2020-12-30: 0.5 mg via INTRAVENOUS
  Filled 2020-12-30: qty 0.5

## 2020-12-30 MED ORDER — CYCLOBENZAPRINE HCL 10 MG PO TABS: 10.0000 mg | ORAL_TABLET | Freq: Three times a day (TID) | ORAL | Status: DC | PRN

## 2020-12-30 MED ORDER — ACETAMINOPHEN 10 MG/ML IV SOLN
1000.0000 mg | Freq: Once | INTRAVENOUS | Status: DC | PRN
  Administered 2020-12-30: 1000 mg via INTRAVENOUS

## 2020-12-30 MED ORDER — THROMBIN 5000 UNITS EX SOLR
CUTANEOUS | Status: AC
  Filled 2020-12-30: qty 5000

## 2020-12-30 MED ORDER — PANTOPRAZOLE SODIUM 40 MG PO TBEC: 40.0000 mg | DELAYED_RELEASE_TABLET | Freq: Every day | ORAL | Status: DC

## 2020-12-30 MED ORDER — FENTANYL CITRATE (PF) 100 MCG/2ML IJ SOLN
INTRAMUSCULAR | Status: DC | PRN
  Administered 2020-12-30: 100 ug via INTRAVENOUS

## 2020-12-30 MED ORDER — AMISULPRIDE (ANTIEMETIC) 5 MG/2ML IV SOLN
INTRAVENOUS | Status: AC
  Filled 2020-12-30: qty 4

## 2020-12-30 MED ORDER — PROPOFOL 1000 MG/100ML IV EMUL
INTRAVENOUS | Status: AC
  Filled 2020-12-30: qty 100

## 2020-12-30 MED ORDER — HYDROCODONE-ACETAMINOPHEN 5-325 MG PO TABS
2.0000 | ORAL_TABLET | ORAL | Status: DC | PRN
  Administered 2020-12-30 – 2020-12-31 (×4): 2 via ORAL
  Filled 2020-12-30 (×4): qty 2

## 2020-12-30 MED ORDER — ACETAMINOPHEN 650 MG RE SUPP: 650.0000 mg | RECTAL | Status: DC | PRN

## 2020-12-30 MED ORDER — SCOPOLAMINE 1 MG/3DAYS TD PT72
MEDICATED_PATCH | TRANSDERMAL | Status: AC
  Administered 2020-12-30: 1.5 mg via TRANSDERMAL
  Filled 2020-12-30: qty 1

## 2020-12-30 MED ORDER — ONDANSETRON HCL 4 MG/2ML IJ SOLN: 4.0000 mg | Freq: Four times a day (QID) | INTRAMUSCULAR | Status: DC | PRN

## 2020-12-30 MED ORDER — ROCURONIUM BROMIDE 10 MG/ML (PF) SYRINGE
PREFILLED_SYRINGE | INTRAVENOUS | Status: DC | PRN
  Administered 2020-12-30: 60 mg via INTRAVENOUS
  Administered 2020-12-30 (×2): 20 mg via INTRAVENOUS

## 2020-12-30 MED ORDER — ALUM & MAG HYDROXIDE-SIMETH 200-200-20 MG/5ML PO SUSP: 30.0000 mL | Freq: Four times a day (QID) | ORAL | Status: DC | PRN

## 2020-12-30 MED ORDER — APREPITANT 40 MG PO CAPS
ORAL_CAPSULE | ORAL | Status: AC
  Administered 2020-12-30: 40 mg via ORAL
  Filled 2020-12-30: qty 1

## 2020-12-30 MED ORDER — EPHEDRINE SULFATE-NACL 50-0.9 MG/10ML-% IV SOSY
PREFILLED_SYRINGE | INTRAVENOUS | Status: DC | PRN
  Administered 2020-12-30: 5 mg via INTRAVENOUS

## 2020-12-30 MED ORDER — THROMBIN 20000 UNITS EX SOLR
CUTANEOUS | Status: AC
  Filled 2020-12-30: qty 20000

## 2020-12-30 MED ORDER — ONDANSETRON HCL 4 MG/2ML IJ SOLN
INTRAMUSCULAR | Status: DC | PRN
  Administered 2020-12-30: 4 mg via INTRAVENOUS

## 2020-12-30 MED ORDER — SUGAMMADEX SODIUM 200 MG/2ML IV SOLN
INTRAVENOUS | Status: DC | PRN
  Administered 2020-12-30: 200 mg via INTRAVENOUS

## 2020-12-30 MED ORDER — 0.9 % SODIUM CHLORIDE (POUR BTL) OPTIME
TOPICAL | Status: DC | PRN
  Administered 2020-12-30: 1000 mL

## 2020-12-30 MED ORDER — PHENYLEPHRINE 40 MCG/ML (10ML) SYRINGE FOR IV PUSH (FOR BLOOD PRESSURE SUPPORT)
PREFILLED_SYRINGE | INTRAVENOUS | Status: DC | PRN
  Administered 2020-12-30: 80 ug via INTRAVENOUS
  Administered 2020-12-30: 40 ug via INTRAVENOUS

## 2020-12-30 MED ORDER — CEFAZOLIN SODIUM-DEXTROSE 2-4 GM/100ML-% IV SOLN
2.0000 g | Freq: Three times a day (TID) | INTRAVENOUS | Status: AC
  Administered 2020-12-30 (×2): 2 g via INTRAVENOUS
  Filled 2020-12-30 (×2): qty 100

## 2020-12-30 MED ORDER — MIDAZOLAM HCL 2 MG/2ML IJ SOLN
INTRAMUSCULAR | Status: DC | PRN
  Administered 2020-12-30: 2 mg via INTRAVENOUS

## 2020-12-30 MED ORDER — PHENYLEPHRINE HCL-NACL 20-0.9 MG/250ML-% IV SOLN
INTRAVENOUS | Status: DC | PRN
  Administered 2020-12-30: 25 ug/min via INTRAVENOUS

## 2020-12-30 MED ORDER — SODIUM CHLORIDE 0.9% FLUSH: 3.0000 mL | INTRAVENOUS | Status: DC | PRN

## 2020-12-30 MED ORDER — THROMBIN 20000 UNITS EX SOLR
CUTANEOUS | Status: DC | PRN
  Administered 2020-12-30: 20 mL via TOPICAL

## 2020-12-30 MED ORDER — ACETAMINOPHEN 10 MG/ML IV SOLN
INTRAVENOUS | Status: AC
  Filled 2020-12-30: qty 100

## 2020-12-30 MED ORDER — METHOCARBAMOL 500 MG PO TABS
500.0000 mg | ORAL_TABLET | Freq: Four times a day (QID) | ORAL | Status: DC | PRN
  Administered 2020-12-30 – 2020-12-31 (×2): 500 mg via ORAL
  Filled 2020-12-30 (×3): qty 1

## 2020-12-30 MED ORDER — HYDROCHLOROTHIAZIDE 12.5 MG PO CAPS
12.5000 mg | ORAL_CAPSULE | Freq: Every day | ORAL | Status: DC
  Filled 2020-12-30 (×2): qty 1

## 2020-12-30 MED ORDER — DEXAMETHASONE SODIUM PHOSPHATE 10 MG/ML IJ SOLN
10.0000 mg | Freq: Once | INTRAMUSCULAR | Status: AC
  Administered 2020-12-30: 10 mg via INTRAVENOUS

## 2020-12-30 MED ORDER — DOCUSATE SODIUM 100 MG PO CAPS
100.0000 mg | ORAL_CAPSULE | Freq: Two times a day (BID) | ORAL | Status: DC
  Administered 2020-12-30 (×2): 100 mg via ORAL
  Filled 2020-12-30 (×2): qty 1

## 2020-12-30 MED ORDER — FENTANYL CITRATE (PF) 250 MCG/5ML IJ SOLN
INTRAMUSCULAR | Status: AC
  Filled 2020-12-30: qty 5

## 2020-12-30 MED ORDER — LACTATED RINGERS IV SOLN: INTRAVENOUS | Status: DC

## 2020-12-30 MED ORDER — ASCORBIC ACID 500 MG PO TABS
500.0000 mg | ORAL_TABLET | Freq: Every day | ORAL | Status: DC
  Administered 2020-12-30: 500 mg via ORAL
  Filled 2020-12-30: qty 1

## 2020-12-30 MED ORDER — ACETAMINOPHEN 500 MG PO TABS: 500.0000 mg | ORAL_TABLET | Freq: Four times a day (QID) | ORAL | Status: DC | PRN

## 2020-12-30 MED ORDER — ONDANSETRON HCL 4 MG PO TABS: 4.0000 mg | ORAL_TABLET | Freq: Four times a day (QID) | ORAL | Status: DC | PRN

## 2020-12-30 MED ORDER — SODIUM CHLORIDE 0.9% FLUSH: 3.0000 mL | Freq: Two times a day (BID) | INTRAVENOUS | Status: DC

## 2020-12-30 MED ORDER — CHLORHEXIDINE GLUCONATE CLOTH 2 % EX PADS: 6.0000 | MEDICATED_PAD | Freq: Once | CUTANEOUS | Status: DC

## 2020-12-30 MED ORDER — CEPHALEXIN 500 MG PO CAPS: 500.0000 mg | ORAL_CAPSULE | Freq: Three times a day (TID) | ORAL | Status: DC

## 2020-12-30 MED ORDER — PHENOL 1.4 % MT LIQD
1.0000 | OROMUCOSAL | Status: DC | PRN
  Filled 2020-12-30: qty 177

## 2020-12-30 MED ORDER — MAGNESIUM GLUCONATE 500 MG PO TABS
500.0000 mg | ORAL_TABLET | ORAL | Status: DC
  Filled 2020-12-30: qty 1

## 2020-12-30 MED ORDER — POLYETHYLENE GLYCOL 3350 17 G PO PACK
17.0000 g | PACK | Freq: Two times a day (BID) | ORAL | Status: DC
  Administered 2020-12-30: 17 g via ORAL
  Filled 2020-12-30: qty 1

## 2020-12-30 MED ORDER — PROPOFOL 10 MG/ML IV BOLUS
INTRAVENOUS | Status: AC
  Filled 2020-12-30: qty 20

## 2020-12-30 MED ORDER — MIDAZOLAM HCL 2 MG/2ML IJ SOLN
INTRAMUSCULAR | Status: AC
  Filled 2020-12-30: qty 2

## 2020-12-30 MED ORDER — VITAMIN E 180 MG (400 UNIT) PO CAPS
400.0000 [IU] | ORAL_CAPSULE | Freq: Every day | ORAL | Status: DC
  Filled 2020-12-30: qty 1

## 2020-12-30 MED ORDER — POTASSIUM 99 MG PO TABS: 99.0000 mg | ORAL_TABLET | Freq: Every day | ORAL | Status: DC

## 2020-12-30 MED ORDER — DIAZEPAM 5 MG PO TABS: 5.0000 mg | ORAL_TABLET | Freq: Three times a day (TID) | ORAL | Status: DC | PRN

## 2020-12-30 MED ORDER — APREPITANT 40 MG PO CAPS: 40.0000 mg | ORAL_CAPSULE | Freq: Once | ORAL | Status: AC

## 2020-12-30 MED ORDER — HYDROCODONE-ACETAMINOPHEN 7.5-325 MG PO TABS: 1.0000 | ORAL_TABLET | ORAL | Status: DC | PRN

## 2020-12-30 MED ORDER — LIDOCAINE 2% (20 MG/ML) 5 ML SYRINGE
INTRAMUSCULAR | Status: DC | PRN
  Administered 2020-12-30: 80 mg via INTRAVENOUS

## 2020-12-30 MED ORDER — FLUOXETINE HCL 20 MG PO CAPS
20.0000 mg | ORAL_CAPSULE | Freq: Every day | ORAL | Status: DC
  Administered 2020-12-30: 20 mg via ORAL
  Filled 2020-12-30: qty 1

## 2020-12-30 MED ORDER — PROPOFOL 10 MG/ML IV BOLUS
INTRAVENOUS | Status: DC | PRN
  Administered 2020-12-30: 160 mg via INTRAVENOUS

## 2020-12-30 MED ORDER — ORAL CARE MOUTH RINSE: 15.0000 mL | Freq: Once | OROMUCOSAL | Status: AC

## 2020-12-30 MED ORDER — SODIUM CHLORIDE 0.9 % IV SOLN: 250.0000 mL | INTRAVENOUS | Status: DC

## 2020-12-30 MED ORDER — AMISULPRIDE (ANTIEMETIC) 5 MG/2ML IV SOLN
10.0000 mg | Freq: Once | INTRAVENOUS | Status: AC
  Administered 2020-12-30: 10 mg via INTRAVENOUS

## 2020-12-30 MED ORDER — MENTHOL 3 MG MT LOZG: 1.0000 | LOZENGE | OROMUCOSAL | Status: DC | PRN

## 2020-12-30 MED ORDER — CEFAZOLIN SODIUM-DEXTROSE 2-4 GM/100ML-% IV SOLN
2.0000 g | INTRAVENOUS | Status: AC
  Administered 2020-12-30: 2 g via INTRAVENOUS
  Filled 2020-12-30: qty 100

## 2020-12-30 MED ORDER — PROPOFOL 500 MG/50ML IV EMUL
INTRAVENOUS | Status: DC | PRN
  Administered 2020-12-30: 20 ug/kg/min via INTRAVENOUS

## 2020-12-30 MED ORDER — SCOPOLAMINE 1 MG/3DAYS TD PT72: 1.0000 | MEDICATED_PATCH | TRANSDERMAL | Status: DC

## 2020-12-30 MED ORDER — VITAMIN D 25 MCG (1000 UNIT) PO TABS: 1000.0000 [IU] | ORAL_TABLET | Freq: Every day | ORAL | Status: DC

## 2020-12-30 MED ORDER — PANTOPRAZOLE SODIUM 40 MG IV SOLR: 40.0000 mg | Freq: Every day | INTRAVENOUS | Status: DC

## 2020-12-30 MED ORDER — ACETAMINOPHEN 325 MG PO TABS: 650.0000 mg | ORAL_TABLET | ORAL | Status: DC | PRN

## 2020-12-30 MED ORDER — THROMBIN 5000 UNITS EX SOLR
OROMUCOSAL | Status: DC | PRN
  Administered 2020-12-30: 5 mL via TOPICAL

## 2020-12-30 MED ORDER — FENTANYL CITRATE (PF) 100 MCG/2ML IJ SOLN
25.0000 ug | INTRAMUSCULAR | Status: DC | PRN
  Administered 2020-12-30: 25 ug via INTRAVENOUS

## 2020-12-30 SURGICAL SUPPLY — 71 items
ADH SKN CLS APL DERMABOND .7 (GAUZE/BANDAGES/DRESSINGS) ×1
APL SKNCLS STERI-STRIP NONHPOA (GAUZE/BANDAGES/DRESSINGS) ×1
BAG COUNTER SPONGE SURGICOUNT (BAG) ×2 IMPLANT
BAG SPNG CNTER NS LX DISP (BAG) ×1
BAND INSRT 18 STRL LF DISP RB (MISCELLANEOUS) ×2
BAND RUBBER #18 3X1/16 STRL (MISCELLANEOUS) ×4 IMPLANT
BASKET BONE COLLECTION (BASKET) ×2 IMPLANT
BENZOIN TINCTURE PRP APPL 2/3 (GAUZE/BANDAGES/DRESSINGS) ×2 IMPLANT
BIT DRILL 13 (BIT) ×1 IMPLANT
BIT DRILL NEURO 2X3.1 SFT TUCH (MISCELLANEOUS) ×1 IMPLANT
BONE VIVIGEN FORMABLE 1.3CC (Bone Implant) ×4 IMPLANT
BUR MATCHSTICK NEURO 3.0 LAGG (BURR) ×2 IMPLANT
CANISTER SUCT 3000ML PPV (MISCELLANEOUS) ×2 IMPLANT
CARTRIDGE OIL MAESTRO DRILL (MISCELLANEOUS) ×1 IMPLANT
DECANTER SPIKE VIAL GLASS SM (MISCELLANEOUS) ×1 IMPLANT
DERMABOND ADVANCED (GAUZE/BANDAGES/DRESSINGS) ×1
DERMABOND ADVANCED .7 DNX12 (GAUZE/BANDAGES/DRESSINGS) ×1 IMPLANT
DIFFUSER DRILL AIR PNEUMATIC (MISCELLANEOUS) ×2 IMPLANT
DRAIN JACKSON PRATT 10MM FLAT (MISCELLANEOUS) ×1 IMPLANT
DRAPE C-ARM 42X72 X-RAY (DRAPES) ×4 IMPLANT
DRAPE LAPAROTOMY 100X72 PEDS (DRAPES) ×2 IMPLANT
DRAPE MICROSCOPE LEICA (MISCELLANEOUS) ×2 IMPLANT
DRILL NEURO 2X3.1 SOFT TOUCH (MISCELLANEOUS) ×2
DRSG OPSITE POSTOP 4X6 (GAUZE/BANDAGES/DRESSINGS) ×1 IMPLANT
DURAPREP 6ML APPLICATOR 50/CS (WOUND CARE) ×2 IMPLANT
ELECT COATED BLADE 2.86 ST (ELECTRODE) ×2 IMPLANT
ELECT REM PT RETURN 9FT ADLT (ELECTROSURGICAL) ×2
ELECTRODE REM PT RTRN 9FT ADLT (ELECTROSURGICAL) ×1 IMPLANT
EVACUATOR SILICONE 100CC (DRAIN) ×1 IMPLANT
GAUZE 4X4 16PLY ~~LOC~~+RFID DBL (SPONGE) ×1 IMPLANT
GAUZE SPONGE 4X4 12PLY STRL (GAUZE/BANDAGES/DRESSINGS) ×1 IMPLANT
GLOVE EXAM NITRILE XL STR (GLOVE) IMPLANT
GLOVE SURG ENC MOIS LTX SZ7 (GLOVE) IMPLANT
GLOVE SURG ENC MOIS LTX SZ8 (GLOVE) ×2 IMPLANT
GLOVE SURG LTX SZ7.5 (GLOVE) ×1 IMPLANT
GLOVE SURG UNDER LTX SZ8.5 (GLOVE) ×2 IMPLANT
GLOVE SURG UNDER POLY LF SZ7 (GLOVE) IMPLANT
GOWN STRL REUS W/ TWL LRG LVL3 (GOWN DISPOSABLE) IMPLANT
GOWN STRL REUS W/ TWL XL LVL3 (GOWN DISPOSABLE) ×1 IMPLANT
GOWN STRL REUS W/TWL 2XL LVL3 (GOWN DISPOSABLE) IMPLANT
GOWN STRL REUS W/TWL LRG LVL3 (GOWN DISPOSABLE)
GOWN STRL REUS W/TWL XL LVL3 (GOWN DISPOSABLE) ×2
GRAFT BNE MATRIX VG FRMBL SM 1 (Bone Implant) IMPLANT
HALTER HD/CHIN CERV TRACTION D (MISCELLANEOUS) ×2 IMPLANT
HEMOSTAT POWDER KIT SURGIFOAM (HEMOSTASIS) ×2 IMPLANT
KIT BASIN OR (CUSTOM PROCEDURE TRAY) ×2 IMPLANT
KIT TURNOVER KIT B (KITS) ×2 IMPLANT
NDL SPNL 20GX3.5 QUINCKE YW (NEEDLE) ×1 IMPLANT
NEEDLE SPNL 20GX3.5 QUINCKE YW (NEEDLE) ×2 IMPLANT
NS IRRIG 1000ML POUR BTL (IV SOLUTION) ×3 IMPLANT
OIL CARTRIDGE MAESTRO DRILL (MISCELLANEOUS) ×2
PACK LAMINECTOMY NEURO (CUSTOM PROCEDURE TRAY) ×2 IMPLANT
PIN DISTRACTION 14MM (PIN) ×2 IMPLANT
PLATE 3 57.5XLCK NS SPNE CVD (Plate) IMPLANT
PLATE 3 ATLANTIS TRANS (Plate) ×2 IMPLANT
SCREW 4.0X13 (Screw) ×2 IMPLANT
SCREW BN 13X4XSLF DRL FXANG (Screw) IMPLANT
SCREW ST FIX 4 ATL 3120213 (Screw) ×7 IMPLANT
SPACER HEDRON C 12X14X6 0D (Spacer) ×1 IMPLANT
SPACER HEDRON C 12X14X7 0D (Spacer) ×2 IMPLANT
SPONGE INTESTINAL PEANUT (DISPOSABLE) ×2 IMPLANT
SPONGE SURGIFOAM ABS GEL 100 (HEMOSTASIS) ×2 IMPLANT
SPONGE T-LAP 4X18 ~~LOC~~+RFID (SPONGE) ×2 IMPLANT
STRIP CLOSURE SKIN 1/2X4 (GAUZE/BANDAGES/DRESSINGS) ×2 IMPLANT
SUT VIC AB 3-0 SH 8-18 (SUTURE) ×2 IMPLANT
SUT VIC AB 4-0 PS2 27 (SUTURE) ×2 IMPLANT
TAPE CLOTH 4X10 WHT NS (GAUZE/BANDAGES/DRESSINGS) ×1 IMPLANT
TOWEL GREEN STERILE (TOWEL DISPOSABLE) ×2 IMPLANT
TOWEL GREEN STERILE FF (TOWEL DISPOSABLE) ×2 IMPLANT
TRAY FOLEY MTR SLVR 16FR STAT (SET/KITS/TRAYS/PACK) ×1 IMPLANT
WATER STERILE IRR 1000ML POUR (IV SOLUTION) ×2 IMPLANT

## 2020-12-30 NOTE — H&P (Signed)
Taylor Gillespie is an 55 y.o. female.   Chief Complaint: Neck pain bilateral shoulder and arm pain HPI: 55 year old female progressive worsening neck pain bilateral shoulder and arm pain work-up revealed severe cervical spondylosis stenosis and cord compression C4-5 C5-6 C6-7.  Due to patient progression of clinical syndrome imaging findings and failed conservative treatment I recommended anterior cervical discectomies and fusion at those 3 levels.  I extensively gone over the risks and benefits of the operation with the patient as well as perioperative course expectations of outcome and alternatives to surgery and she understood and agreed to proceed forward.  Past Medical History:  Diagnosis Date   Anxiety    Arthritis    Depression    Dizziness    Dizziness and giddiness    Endometriosis    Hypertension    Osteoarthritis    Presence of upper and lower permanent dental bridges    only perm upper bridge - per patient perm bridge is loose    Spinal stenosis, cervical region    C5-6, C6-7    Past Surgical History:  Procedure Laterality Date   BACK SURGERY  2004   COLONOSCOPY  05/2014   gessner, repeat 5 yrs, [facility]   JOINT REPLACEMENT     TOTAL ABDOMINAL HYSTERECTOMY  2003   TOTAL HIP ARTHROPLASTY Left 04/30/2020   Procedure: TOTAL HIP ARTHROPLASTY ANTERIOR APPROACH/ EXCHANGE OF Alexander Bergeron FEMORAL HEAD;  Surgeon: Durene Romans, MD;  Location: [facility]S;  Service: Orthopedics;  Laterality: Left;   UPPER GI ENDOSCOPY  05/2014   [facility] Payor    Family History  Problem Relation Age of Onset   Colon cancer Father        deceased   Diabetes Mellitus II Father    Hypertension Father    Diabetes Mother        deceased   Arthritis Mother    Hypertension Mother    Hypertension Brother    Hypertension Sister        two sisters   Breast cancer Maternal Grandmother    Social History:  reports that she quit smoking about 22 months ago. Her smoking use included cigarettes. She has a 15.00  pack-year smoking history. She has never used smokeless tobacco. She reports current alcohol use. She reports that she does not use drugs.  Allergies:  Allergies  Allergen Reactions   Diclofenac Shortness Of Breath, Itching and Nausea And Vomiting   Ferrous Sulfate     flushing   Morphine Nausea And Vomiting   Lisinopril Cough   Shellfish Allergy Rash    Sea food allergy.    Medications Prior to Admission  Medication Sig Dispense Refill   acetaminophen (TYLENOL) 500 MG tablet Take 500 mg by mouth every 6 (six) hours as needed for moderate pain.     cholecalciferol (VITAMIN D3) 25 MCG (1000 UNIT) tablet Take 1,000 Units by mouth daily.     diazepam (VALIUM) 5 MG tablet Take 5 mg by mouth at bedtime as needed for sleep.     FLUoxetine (PROZAC) 20 MG capsule Take 20 mg by mouth daily after lunch.      hydrochlorothiazide (MICROZIDE) 12.5 MG capsule Take 12.5 mg by mouth daily.     ibuprofen (ADVIL) 200 MG tablet Take 400 mg by mouth every 6 (six) hours as needed for moderate pain.     losartan (COZAAR) 50 MG tablet Take 50 mg by mouth daily.     magnesium gluconate (MAGONATE) 500 MG tablet Take 500 mg by mouth  every 3 (three) days.     naproxen (NAPROSYN) 500 MG tablet Take 500 mg by mouth 2 (two) times daily as needed for pain.     pantoprazole (PROTONIX) 40 MG tablet TAKE 1 TABLET (40 MG TOTAL) BY MOUTH DAILY. (Patient taking differently: Take 40 mg by mouth daily after lunch.) 30 tablet 5   Potassium 99 MG TABS Take 99 mg by mouth daily.     vitamin C (ASCORBIC ACID) 500 MG tablet Take 500 mg by mouth daily.     Vitamin E 180 MG (400 UNIT) CAPS Take 400 Units by mouth daily.     cephALEXin (KEFLEX) 500 MG capsule Take 1 capsule (500 mg total) by mouth 3 (three) times daily. (Patient not taking: No sig reported) 21 capsule 0   docusate sodium (COLACE) 100 MG capsule Take 1 capsule (100 mg total) by mouth 2 (two) times daily. (Patient not taking: No sig reported) 28 capsule 0    HYDROcodone-acetaminophen (NORCO) 7.5-325 MG tablet Take 1-2 tablets by mouth every 4 (four) hours as needed for moderate pain. (Patient not taking: No sig reported) 60 tablet 0   methocarbamol (ROBAXIN) 500 MG tablet Take 1 tablet (500 mg total) by mouth every 6 (six) hours as needed for muscle spasms. (Patient not taking: No sig reported) 40 tablet 0   polyethylene glycol (MIRALAX / GLYCOLAX) 17 g packet Take 17 g by mouth 2 (two) times daily. (Patient not taking: No sig reported) 28 packet 0    Results for orders placed or performed during the hospital encounter of 12/28/20 (from the past 48 hour(s))  SARS CORONAVIRUS 2 (TAT 6-24 HRS) Nasopharyngeal Nasopharyngeal Swab     Status: None   Collection Time: 12/28/20 10:50 AM   Specimen: Nasopharyngeal Swab  Result Value Ref Range   SARS Coronavirus 2 NEGATIVE NEGATIVE    Comment: (NOTE) SARS-CoV-2 target nucleic acids are NOT DETECTED.  The SARS-CoV-2 RNA is generally detectable in upper and lower respiratory specimens during the acute phase of infection. Negative results do not preclude SARS-CoV-2 infection, do not rule out co-infections with other pathogens, and should not be used as the sole basis for treatment or other patient management decisions. Negative results must be combined with clinical observations, patient history, and epidemiological information. The expected result is Negative.  Fact Sheet for Patients: HairSlick.no  Fact Sheet for Healthcare Providers: quierodirigir.com  This test is not yet approved or cleared by the [place] FDA and  has been authorized for detection and/or diagnosis of SARS-CoV-2 by FDA under an Emergency Use Authorization (EUA). This EUA will remain  in effect (meaning this test can be used) for the duration of the COVID-19 declaration under Se ction 564(b)(1) of the Act, 21 U.S.C. section 360bbb-3(b)(1), unless the authorization is  terminated or revoked sooner.  Performed at [facility], [address], [place], [place] [address]    No results found.  Review of Systems  Musculoskeletal:  Positive for back pain and neck pain.  Neurological:  Positive for numbness.   Blood pressure 130/83, pulse 80, temperature 98.5 F (36.9 C), temperature source Oral, resp. rate 18, height 5\' 7"  (1.702 m), weight 111.6 kg, SpO2 97 %. Physical Exam HENT:     Head: Normocephalic.     Right Ear: Tympanic membrane normal.     Nose: Nose normal.     Mouth/Throat:     Mouth: Mucous membranes are moist.  Cardiovascular:     Rate and Rhythm: Normal rate.  Pulmonary:     Effort: Pulmonary effort is normal.  Abdominal:     General: Abdomen is flat.  Musculoskeletal:        General: Normal range of motion.  Skin:    General: Skin is warm.  Neurological:     Mental Status: She is alert.     Comments: Strength 5-5 deltoid, bicep, tricep, wrist flexion, wrist extension, hand intrinsics.     Assessment/Plan 55 year old female presents for anterior cervical discectomies and fusion at C4-5 C5-6 C6-7  Mariam Dollar, MD 12/30/2020, 7:53 AM

## 2020-12-30 NOTE — Op Note (Signed)
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  Work-up revealed severe cervical stenosis cord compression C4-5 C5-6 C6-7 with kyphosis.  Due to patient progression of clinical syndrome imaging findings and failed conservative treatment I recommended anterior cervical discectomies and fusion at those 3 levels.  I extensively went over the risks and benefits of the operation with her as well as perioperative course expectations of outcome and alternatives of surgery and she understood and agreed to proceed forward.  Operative procedure: Patient was brought into the OR was [facility] general anesthesia positioned supine the neck in slight extension 5 pounds halter traction.  The right 7 neck was prepped and draped in routine sterile fashion.  Preoperative x-ray localized the appropriate level.  A curvilinear incision was made just off the midline to the entry border of the sternocleidomastoid and the superficial platysma was dissected out divided longitudinally.  The avascular plane between the sternomastoid and strap muscles was developed down to the prevertebral fascia and the prevertebral fascia was dissected away with Kitners.  Intraoperative x-ray confirmed indication appropriate level.  Annulotomy's were made with a 15 blade scalpel longus was reflected laterally at all 3 levels and self-retaining retractors were placed.  Large  anterior osteophytes were removed with a Leksell rongeur and a 2 intermittent Kerrison punch.  All 3 disc bases were drilled down capturing the bone shavings and mucus trap.  Then the operating microscope was draped and brought into the field and microscope illumination first working at [employer] posterior aspect of the osteophytic complex was drilled down the posterior logical ligament was identified and removed in piecemeal fashion large spurs, no posterior endplates were then removed marching laterally patient was noted to have marked onset of her defibrillator this was all removed in piecemeal fashion decompressing the skeletonized C7 nerve root flush with the pedicle.  This was then packed with Gelfoam tension taken at C5-6 and C5-6 in a similar fashion disc base was drilled down the posterior annulus and osteophytic complexes this disc base was collapsed it was opened up and aggressive under biting of both endplates decompressing central canal both C6 nerve roots were identified both C6 nerve roots were skeletonized decompressed flush with pedicle.  I then sized up and selected 2 cages a 6 mm cage at C5-6-7 over cage at C6-7 both were packed with the locally harvested autograft mixed and inserted 1 to 2 mm deep to the intervertebral line.  Then tension taken at C4-5 and C4-5 disc was mostly soft disc material was all removed decompressing the thecal sac both C5 pedicles were identified both C5 nerve roots were decompressed and both endplates were aggressively under Bitton to decompress the central canal further.  I selected a 7 mm cage here and inserted it.  Then under fluoroscopy we placed a 50 7/2 Atlantis translational plate all screws had excellent purchase locking mechanisms were engaged I packed some additional bone graft along the side of the cages and underneath the plate placed a JP drain meticulously stasis was obtained and the wound  was closed in layers with interrupted Vicryl and a running 4 subcuticular  Dermabond benzoin Steri-Strips and a sterile dressing was applied patient recovery in stable condition.  At the end the case all needle counts and sponge counts were correct.

## 2020-12-30 NOTE — Anesthesia Procedure Notes (Signed)
Procedure Name: Intubation Date/Time: 12/30/2020 8:35 AM Performed by: Jenkins Rouge, RN Pre-anesthesia Checklist: Patient identified, Emergency Drugs available, Suction available and Patient being monitored Patient Re-evaluated:Patient Re-evaluated prior to induction Oxygen Delivery Method: Circle System Utilized Preoxygenation: Pre-oxygenation with 100% oxygen Induction Type: IV induction Ventilation: Mask ventilation without difficulty Laryngoscope Size: Glidescope and 3 Grade View: Grade I Tube type: Oral Number of attempts: 1 Airway Equipment and Method: Stylet Placement Confirmation: ETT inserted through vocal cords under direct vision, positive ETCO2 and breath sounds checked- equal and bilateral Secured at: 22 cm Tube secured with: Tape Dental Injury: Teeth and Oropharynx as per pre-operative assessment  Comments: In-line stabilization of head and neck maintained during induction and intubation

## 2020-12-30 NOTE — Anesthesia Postprocedure Evaluation (Signed)
Anesthesia Post Note  Patient: [name]  Procedure(s) Performed: Anterior Cervical Decompression Fusion -  - Cervical Four-Cervical Five - Cervical Five-Cervical  Six - Cervical Six-Cervical Seven     Patient location during evaluation: PACU Anesthesia Type: General Level of consciousness: awake and alert Pain management: pain level controlled Vital Signs Assessment: post-procedure vital signs reviewed and stable Respiratory status: spontaneous breathing, nonlabored ventilation, respiratory function stable and patient connected to nasal cannula oxygen Cardiovascular status: blood pressure returned to baseline and stable Postop Assessment: no apparent nausea or vomiting Anesthetic complications: no   No notable events documented.  Last Vitals:  Vitals:   12/30/20 1359 12/30/20 1646  BP: (!) 150/81 137/83  Pulse: 87 95  Resp: 18 18  Temp: 36.6 C 36.6 C  SpO2: 95% 93%    Last Pain:  Vitals:   12/30/20 1646  TempSrc: Oral  PainSc:                  Nelle Don Jilda Kress

## 2020-12-30 NOTE — Progress Notes (Signed)
Orthopedic Tech Progress Note Patient Details:  Taylor Gillespie 13-Nov-1965 [id number]  [facility] stated " patient has soft collar"   Patient ID: Taylor Gillespie, female   DOB: March 29, 1965, 55 y.o.   MRN: [id number]  Taylor Gillespie 12/30/2020, 3:38 PM

## 2020-12-30 NOTE — Transfer of Care (Signed)
Immediate Anesthesia Transfer of Care Note  Patient: Taylor Gillespie  Procedure(s) Performed: Anterior Cervical Decompression Fusion -  - Cervical Four-Cervical Five - Cervical Five-Cervical  Six - Cervical Six-Cervical Seven  Patient Location: PACU  Anesthesia Type:General  Level of Consciousness: drowsy and patient cooperative  Airway & Oxygen Therapy: Patient Spontanous Breathing and Patient connected to face mask oxygen  Post-op Assessment: Report given to RN, Post -op Vital signs reviewed and stable and Patient moving all extremities X 4  Post vital signs: Reviewed and stable  Last Vitals:  Vitals Value Taken Time  BP 123/64   Temp    Pulse 76 12/30/20 1125  Resp 12 12/30/20 1125  SpO2 98 % 12/30/20 1125  Vitals shown include unvalidated device data.  Last Pain:  Vitals:   12/30/20 0703  TempSrc: Oral         Complications: No notable events documented.

## 2020-12-31 MED ORDER — HYDROCODONE-ACETAMINOPHEN 5-325 MG PO TABS: 1.0000 | ORAL_TABLET | ORAL | 0 refills | Status: AC | PRN

## 2020-12-31 MED ORDER — CYCLOBENZAPRINE HCL 10 MG PO TABS: 10.0000 mg | ORAL_TABLET | Freq: Three times a day (TID) | ORAL | 0 refills | Status: DC | PRN

## 2020-12-31 NOTE — Discharge Summary (Signed)
Physician Discharge Summary  Patient ID: Taylor Gillespie MRN: [id number] DOB/AGE: 54-Jul-1967 54 y.o.  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 The patient tolerated the procedure well and was taken to the recovery room and then to the floor in stable condition. The hospital course was routine. There were no complications. The wound remained clean dry and intact. Pt had appropriate neck soreness. No complaints of arm pain or new N/T/W. The patient remained afebrile with stable vital signs, and tolerated a regular diet. The patient continued to increase activities, and pain was well controlled with oral pain medications.   Consults: None  Significant Diagnostic Studies:  Results for orders placed or performed during the hospital encounter of 12/05/18  ABO/Rh  Result Value Ref Range   ABO/RH(D)      O POS Performed at [facility], [address], [place], [place] [address]     DG Cervical Spine 1 View  Result Date: 12/30/2020 CLINICAL DATA:  Neck pain, cervical fusion EXAM: DG CERVICAL SPINE - 1 VIEW COMPARISON:  Cervical spine radiograph done on 08/23/2018 FINDINGS: Fluoroscopic images show anterior surgical fusion from C4-C7 levels. Intervertebral disc spacers are noted. Fluoroscopic time was 16 seconds.  Radiation dose is 6.29 mGy. IMPRESSION: Fluoroscopic assistance was provided for surgical fusion in the cervical spine. Electronically Signed   By: Ernie Avena M.D.   On: 12/30/2020 11:44   DG C-Arm 1-60 Min-No Report  Result Date: 12/30/2020 Fluoroscopy was utilized by the requesting physician.  No radiographic interpretation.   DG C-Arm 1-60  Min-No Report  Result Date: 12/30/2020 Fluoroscopy was utilized by the requesting physician.  No radiographic interpretation.    Antibiotics:  Anti-infectives (From admission, onward)    Start     Dose/Rate Route Frequency Ordered Stop   12/30/20 1630  ceFAZolin (ANCEF) IVPB 2g/100 mL premix        2 g 200 mL/hr over 30 Minutes Intravenous Every 8 hours 12/30/20 1402 12/31/20 0012   12/30/20 1600  cephALEXin (KEFLEX) capsule 500 mg  Status:  Discontinued        500 mg Oral 3 times daily 12/30/20 1402 12/30/20 1410   12/30/20 0715  ceFAZolin (ANCEF) IVPB 2g/100 mL premix        2 g 200 mL/hr over 30 Minutes Intravenous On call to O.R. 12/30/20 0704 12/30/20 0836       Discharge Exam: Blood pressure 131/64, pulse 68, temperature 97.9 F (36.6 C), temperature source Oral, resp. rate 18, height 5\' 7"  (1.702 m), weight 111.6 kg, SpO2 92 %. Neurologic: Grossly normal Ambulating and voiding well, incision cdi   Discharge Medications:   Allergies as of 12/31/2020       Reactions   Diclofenac Shortness Of Breath, Itching, Nausea And Vomiting   Ferrous Sulfate    flushing   Morphine Nausea And Vomiting   Lisinopril Cough   Shellfish Allergy Rash   Sea food allergy.        Medication List     STOP taking these medications    cephALEXin 500 MG capsule Commonly known as: Keflex   HYDROcodone-acetaminophen 7.5-325 MG tablet Commonly known as: Norco Replaced by: HYDROcodone-acetaminophen 5-325 MG tablet   methocarbamol 500 MG tablet  Commonly known as: Robaxin       TAKE these medications    acetaminophen 500 MG tablet Commonly known as: TYLENOL Take 500 mg by mouth every 6 (six) hours as needed for moderate pain.   cholecalciferol 25 MCG (1000 UNIT) tablet Commonly known as: VITAMIN D3 Take 1,000 Units by mouth daily.   cyclobenzaprine 10 MG tablet Commonly known as: FLEXERIL Take 1 tablet (10 mg total) by mouth 3 (three) times daily as needed for muscle  spasms.   diazepam 5 MG tablet Commonly known as: VALIUM Take 5 mg by mouth at bedtime as needed for sleep.   docusate sodium 100 MG capsule Commonly known as: Colace Take 1 capsule (100 mg total) by mouth 2 (two) times daily.   FLUoxetine 20 MG capsule Commonly known as: PROZAC Take 20 mg by mouth daily after lunch.   hydrochlorothiazide 12.5 MG capsule Commonly known as: MICROZIDE Take 12.5 mg by mouth daily.   HYDROcodone-acetaminophen 5-325 MG tablet Commonly known as: NORCO/VICODIN Take 1 tablet by mouth every 4 (four) hours as needed for severe pain ((score 7 to 10)). Replaces: HYDROcodone-acetaminophen 7.5-325 MG tablet   ibuprofen 200 MG tablet Commonly known as: ADVIL Take 400 mg by mouth every 6 (six) hours as needed for moderate pain.   losartan 50 MG tablet Commonly known as: COZAAR Take 50 mg by mouth daily.   magnesium gluconate 500 MG tablet Commonly known as: MAGONATE Take 500 mg by mouth every 3 (three) days.   naproxen 500 MG tablet Commonly known as: NAPROSYN Take 500 mg by mouth 2 (two) times daily as needed for pain.   pantoprazole 40 MG tablet Commonly known as: PROTONIX TAKE 1 TABLET (40 MG TOTAL) BY MOUTH DAILY. What changed: when to take this   polyethylene glycol 17 g packet Commonly known as: MIRALAX / GLYCOLAX Take 17 g by mouth 2 (two) times daily.   Potassium 99 MG Tabs Take 99 mg by mouth daily.   vitamin C 500 MG tablet Commonly known as: ASCORBIC ACID Take 500 mg by mouth daily.   Vitamin E 180 MG (400 UNIT) Caps Take 400 Units by mouth daily.        Disposition: home   Final Dx: acdf C4-C7  Discharge Instructions     Call MD for:  difficulty breathing, headache or visual disturbances   Complete by: As directed    Call MD for:  hives   Complete by: As directed    Call MD for:  persistant dizziness or light-headedness   Complete by: As directed    Call MD for:  persistant nausea and vomiting   Complete by: As  directed    Call MD for:  redness, tenderness, or signs of infection (pain, swelling, redness, odor or green/yellow discharge around incision site)   Complete by: As directed    Call MD for:  severe uncontrolled pain   Complete by: As directed    Call MD for:  temperature >100.4   Complete by: As directed    Diet - low sodium heart healthy   Complete by: As directed    Driving Restrictions   Complete by: As directed    No driving for 2 weeks, no riding in the car for 1 week   Increase activity slowly   Complete by: As directed    Lifting restrictions   Complete by: As directed    No lifting more than 8 lbs   Remove dressing in 48 hours  Complete by: As directed           Signed: Tiana Loft Liesl Simons 12/31/2020, 7:55 AM

## 2020-12-31 NOTE — Evaluation (Signed)
Occupational Therapy Evaluation Patient Details Name: Taylor Gillespie MRN: [id number] DOB: [birth year] Today's Date: 12/31/2020   History of Present Illness 55 y.o. female presents to [facility] hospital on 12/30/2020 with neck and bilateral UE pain. 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 Pt underwent C4-7 ACDF on 12/30/2020. PMH includes anxiety, depression, HTN, OA.   Clinical Impression   Pt lives with spouse who will be able to assist at DC and other family for IADLS. Pt self reports limited ability to complete LE ADLS at PLOF due to hip sx and multiple dislocations. Pt in session was educated about precautions and brace use. Pt at this time educated also on further AE on how to complete LE ADLS due to precautions and LE limitations. Pt required min assist with LE ADLS, supervision with UE ADLS and supervision to min guard with ambulation and hurricane cane. Pt currently with functional limitations due to the deficits listed below (see OT Problem List).  Pt will benefit from skilled OT to increase their safety and independence with ADL and functional mobility for ADL to facilitate discharge to venue listed below.        Recommendations for follow up therapy are one component of a multi-disciplinary discharge planning process, led by the attending physician.  Recommendations may be updated based on patient status, additional functional criteria and insurance authorization.   Follow Up Recommendations  Follow physician's recommendations for discharge plan and follow up therapies    Assistance Recommended at Discharge Set up Supervision/Assistance  Functional Status Assessment  Patient has had a recent decline in their functional status and demonstrates the ability to make significant improvements in function in a reasonable and predictable amount of time.  Equipment Recommendations  Tub/shower bench    Recommendations for Other Services       Precautions / Restrictions  Precautions Precautions: Cervical;Other (comment) Precaution Booklet Issued: Yes (comment) (limited L hip mobility due to recurrent hip surgeries) Precaution Comments: PT provides verbal cues for neck precautions Required Braces or Orthoses: Cervical Brace Cervical Brace: Soft collar Restrictions Weight Bearing Restrictions: No      Mobility Bed Mobility Overal bed mobility: Modified Independent             General bed mobility comments: HOB elevated, increased time    Transfers Overall transfer level: Needs assistance Equipment used: Straight cane Transfers: Sit to/from Stand Sit to Stand: Supervision           General transfer comment: hand placement cue      Balance Overall balance assessment: Mild deficits observed, not formally tested                                         ADL either performed or assessed with clinical judgement   ADL Overall ADL's : Needs assistance/impaired Eating/Feeding: Independent;Sitting   Grooming: Wash/dry hands;Wash/dry face;Supervision/safety;Cueing for safety;Cueing for sequencing;Standing   Upper Body Bathing: Supervision/ safety;Cueing for safety;Cueing for sequencing;Sitting   Lower Body Bathing: Minimal assistance;Cueing for safety;Cueing for sequencing;Sit to/from stand   Upper Body Dressing : Supervision/safety;Cueing for safety;Cueing for sequencing;Sitting   Lower Body Dressing: Minimal assistance;Cueing for safety;Cueing for sequencing;Sit to/from stand   Toilet Transfer: Supervision/safety;Cueing for safety;Cueing for sequencing;Regular Toilet;Grab bars   Toileting- Clothing Manipulation and Hygiene: Supervision/safety;Cueing for safety;Cueing for sequencing;Sit to/from stand   Tub/ Shower Transfer: Minimal assistance;Cueing for safety;Cueing for sequencing;Tub bench;Grab bars   Functional mobility during  ADLs: Min guard;Cueing for safety;Cueing for sequencing;Cane       Vision          Perception     Praxis      Pertinent Vitals/Pain Pain Assessment: 0-10 Pain Score: 2  Pain Location: surgical site Pain Descriptors / Indicators: Sore Pain Intervention(s): Monitored during session     Hand Dominance Right   Extremity/Trunk Assessment Upper Extremity Assessment Upper Extremity Assessment: Overall WFL for tasks assessed   Lower Extremity Assessment Lower Extremity Assessment: Generalized weakness   Cervical / Trunk Assessment Cervical / Trunk Assessment: Neck Surgery   Communication Communication Communication: No difficulties   Cognition Arousal/Alertness: Awake/alert Behavior During Therapy: WFL for tasks assessed/performed Overall Cognitive Status: Within Functional Limits for tasks assessed                                       General Comments  VSS on RA    Exercises     Shoulder Instructions      Home Living Family/patient expects to be discharged to:: Private residence Living Arrangements: Spouse/significant other Available Help at Discharge: Family Type of Home: House Home Access: Stairs to enter Secretary/administrator of Steps: 5 Entrance Stairs-Rails: None Home Layout: Two level;Able to live on main level with bedroom/bathroom     Bathroom Shower/Tub: Tub/shower unit   Bathroom Toilet: Standard Bathroom Accessibility: Yes   Home Equipment: Rolling Walker (2 wheels);Grab bars - toilet;Grab bars - tub/shower;Shower seat;Wheelchair - manual;Adaptive equipment;Cane - single point (hurry cane) Adaptive Equipment: Reacher        Prior Functioning/Environment Prior Level of Function : Needs assist       Physical Assist : ADLs (physical)   ADLs (physical): Dressing;IADLs Mobility Comments: pt mobilizing with use of hurry cane, continuing to recover from hip surgeries ADLs Comments: Per pt husband will assist with LE ADLS        OT Problem List: Decreased activity tolerance;Impaired balance (sitting and/or  standing);Decreased safety awareness;Pain      OT Treatment/Interventions: Self-care/ADL training;Therapeutic exercise;DME and/or AE instruction;Therapeutic activities;Patient/family education;Balance training    OT Goals(Current goals can be found in the care plan section) Acute Rehab OT Goals Patient Stated Goal: to get home soon OT Goal Formulation: With patient Time For Goal Achievement: 01/16/21 Potential to Achieve Goals: Good ADL Goals Pt Will Perform Upper Body Dressing: sitting;Independently Pt Will Perform Lower Body Dressing: with modified independence;sit to/from stand Pt Will Perform Tub/Shower Transfer: with modified independence;ambulating;tub bench;grab bars  OT Frequency: Min 2X/week   Barriers to D/C:            Co-evaluation              AM-PAC OT "6 Clicks" Daily Activity     Outcome Measure Help from another person eating meals?: None Help from another person taking care of personal grooming?: None Help from another person toileting, which includes using toliet, bedpan, or urinal?: None Help from another person bathing (including washing, rinsing, drying)?: A Little Help from another person to put on and taking off regular upper body clothing?: None Help from another person to put on and taking off regular lower body clothing?: A Little 6 Click Score: 22   End of Session Equipment Utilized During Treatment: Gait belt;Cervical collar Nurse Communication:  (equipment needs)  Activity Tolerance: Patient tolerated treatment well Patient left: in bed;with call bell/phone within reach  OT Visit Diagnosis: Unsteadiness  on feet (R26.81);Other abnormalities of gait and mobility (R26.89);Pain Pain - Right/Left:  (sx site in neck)                Time: 1610-9604 OT Time Calculation (min): 29 min Charges:  OT General Charges $OT Visit: 1 Visit OT Evaluation $OT Eval Low Complexity: 1 Low OT Treatments $Self Care/Home Management : 8-22 mins  Alphia Moh  OTR/L  Acute Rehab Services  [phone] office number [phone] pager number   Alphia Moh 12/31/2020, 9:53 AM

## 2020-12-31 NOTE — Evaluation (Signed)
Physical Therapy Evaluation Patient Details Name: Taylor Gillespie MRN: [id number] DOB: November 27, 1965 Today's Date: 12/31/2020  History of Present Illness  55 y.o. female presents to [facility] hospital on 12/30/2020 with neck and bilateral UE pain. 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 Pt underwent C4-7 ACDF on 12/30/2020. PMH includes anxiety, depression, HTN, OA.  Clinical Impression  Pt presents to PT with deficits in strength, power, gait, balance, endurance, however most of these deficits appear more chronic in nature due to multiple L hip surgeries and dislocations. Pt appears to be near her more recent baseline, ambulating with use of cane for household distances. Pt demonstrates the ability to negotiate steps without physical assistance. PT provides verbal and written education of neck precautions and brace use. PT recommends continued acute PT services to aide in improving activity tolerance. PT also recommends outpatient PT for L hip strengthening once cleared for more strenuous activity by surgeon.       Recommendations for follow up therapy are one component of a multi-disciplinary discharge planning process, led by the attending physician.  Recommendations may be updated based on patient status, additional functional criteria and insurance authorization.  Follow Up Recommendations No PT follow up (will benefit from continued outpatient PT for L hip strengthening once cleared by surgeon)    Assistance Recommended at Discharge Intermittent Supervision/Assistance  Functional Status Assessment Patient has had a recent decline in their functional status and demonstrates the ability to make significant improvements in function in a reasonable and predictable amount of time.  Equipment Recommendations  None recommended by PT    Recommendations for Other Services       Precautions / Restrictions Precautions Precautions: Cervical;Other (comment) Precaution Booklet Issued: Yes  (comment) (limited L hip mobility due to recurrent hip surgeries) Precaution Comments: PT provides verbal cues for neck precautions Required Braces or Orthoses: Cervical Brace Cervical Brace: Soft collar Restrictions Weight Bearing Restrictions: No      Mobility  Bed Mobility Overal bed mobility: Modified Independent             General bed mobility comments: HOB elevated, increased time    Transfers Overall transfer level: Needs assistance Equipment used: Straight cane Transfers: Sit to/from Stand Sit to Stand: Supervision           General transfer comment: hand placement cue    Ambulation/Gait Ambulation/Gait assistance: Supervision Gait Distance (Feet): 200 Feet Assistive device: Straight cane Gait Pattern/deviations: Step-through pattern Gait velocity: reduced Gait velocity interpretation: 1.31 - 2.62 ft/sec, indicative of limited community ambulator General Gait Details: pt with slowed step-through gait  Stairs Stairs: Yes Stairs assistance: Supervision Stair Management: Forwards;With cane Number of Stairs: 5    Wheelchair Mobility    Modified Rankin (Stroke Patients Only)       Balance Overall balance assessment: Mild deficits observed, not formally tested                                           Pertinent Vitals/Pain Pain Assessment: 0-10 Pain Score: 2  Pain Location: surgical site Pain Descriptors / Indicators: Sore Pain Intervention(s): Monitored during session    Home Living Family/patient expects to be discharged to:: Private residence Living Arrangements: Spouse/significant other Available Help at Discharge: Family Type of Home: House Home Access: Stairs to enter Entrance Stairs-Rails: None Entrance Stairs-Number of Steps: 5   Home Layout: Two level;Able to live on main  level with bedroom/bathroom Home Equipment: Rolling Walker (2 wheels);Grab bars - toilet;Grab bars - tub/shower;Shower seat;Wheelchair -  manual;Adaptive equipment;Cane - single point (hurry cane)      Prior Function Prior Level of Function : Needs assist       Physical Assist : ADLs (physical)   ADLs (physical): Dressing;IADLs Mobility Comments: pt mobilizing with use of hurry cane, continuing to recover from hip surgeries ADLs Comments: Per pt husband will assist with LE ADLS     Hand Dominance   Dominant Hand: Right    Extremity/Trunk Assessment   Upper Extremity Assessment Upper Extremity Assessment: Overall WFL for tasks assessed    Lower Extremity Assessment Lower Extremity Assessment: Generalized weakness    Cervical / Trunk Assessment Cervical / Trunk Assessment: Neck Surgery  Communication   Communication: No difficulties  Cognition Arousal/Alertness: Awake/alert Behavior During Therapy: WFL for tasks assessed/performed Overall Cognitive Status: Within Functional Limits for tasks assessed                                          General Comments General comments (skin integrity, edema, etc.): VSS on RA    Exercises     Assessment/Plan    PT Assessment Patient needs continued PT services  PT Problem List Decreased strength;Decreased activity tolerance;Decreased mobility;Decreased balance;Decreased knowledge of precautions;Pain       PT Treatment Interventions DME instruction;Gait training;Stair training;Functional mobility training;Therapeutic activities;Therapeutic exercise;Balance training;Patient/family education    PT Goals (Current goals can be found in the Care Plan section)  Acute Rehab PT Goals Patient Stated Goal: to go home and recover PT Goal Formulation: With patient Time For Goal Achievement: 01/05/21 Potential to Achieve Goals: Good    Frequency Min 5X/week   Barriers to discharge        Co-evaluation               AM-PAC PT "6 Clicks" Mobility  Outcome Measure Help needed turning from your back to your side while in a flat bed without  using bedrails?: None Help needed moving from lying on your back to sitting on the side of a flat bed without using bedrails?: None Help needed moving to and from a bed to a chair (including a wheelchair)?: A Little Help needed standing up from a chair using your arms (e.g., wheelchair or bedside chair)?: A Little Help needed to walk in hospital room?: A Little Help needed climbing 3-5 steps with a railing? : A Little 6 Click Score: 20    End of Session Equipment Utilized During Treatment: Cervical collar Activity Tolerance: Patient tolerated treatment well Patient left: in bed;with call bell/phone within reach Nurse Communication: Mobility status PT Visit Diagnosis: Other abnormalities of gait and mobility (R26.89);Muscle weakness (generalized) (M62.81)    Time: 7782-4235 PT Time Calculation (min) (ACUTE ONLY): 10 min   Charges:   PT Evaluation $PT Eval Low Complexity: 1 Low          Arlyss Gandy, PT, DPT Acute Rehabilitation Pager: [phone] Office [phone]   Arlyss Gandy 12/31/2020, 9:16 AM

## 2020-12-31 NOTE — Plan of Care (Signed)
Pt doing well. Pt given D/C instructions with verbal understanding. Rx's were sent to the pharmacy by MD. Pt's incision is clean and dry with no sign of infection. Pt's IV was removed prior to D/C. Pt stated she will get the transfer bench once she is D/C'd. Pt D/C'd home via wheelchair per MD order. Pt is stable @ D/C and has no other needs at this time. Rema Fendt, RN

## 2021-01-01 ENCOUNTER — Encounter (HOSPITAL_COMMUNITY): Payer: Self-pay | Admitting: Neurosurgery

## 2021-05-24 ENCOUNTER — Encounter: Payer: Self-pay | Admitting: Podiatry

## 2021-05-24 ENCOUNTER — Other Ambulatory Visit: Payer: Self-pay

## 2021-05-24 ENCOUNTER — Ambulatory Visit: Payer: BC Managed Care – PPO | Admitting: Podiatry

## 2021-05-24 ENCOUNTER — Ambulatory Visit (INDEPENDENT_AMBULATORY_CARE_PROVIDER_SITE_OTHER): Payer: BC Managed Care – PPO

## 2021-05-24 DIAGNOSIS — M779 Enthesopathy, unspecified: Secondary | ICD-10-CM

## 2021-05-24 DIAGNOSIS — M7752 Other enthesopathy of left foot: Secondary | ICD-10-CM | POA: Diagnosis not present

## 2021-05-24 DIAGNOSIS — M722 Plantar fascial fibromatosis: Secondary | ICD-10-CM

## 2021-05-24 MED ORDER — TRIAMCINOLONE ACETONIDE 10 MG/ML IJ SUSP
20.0000 mg | Freq: Once | INTRAMUSCULAR | Status: AC
  Administered 2021-05-24: 20 mg

## 2021-05-24 NOTE — Progress Notes (Signed)
Subjective:  ? ?Patient ID: Taylor Gillespie, female   DOB: 56 y.o.   MRN: [id number]  ? ?HPI ?Patient presents stating she has more pain in the left foot than the right foot but they both hurt and she has had hip replacements with the left one not doing well neck fusion and a lot of osteoarthritis along with obesity.  Patient does not currently smoke likes to be active if possible ? ? ?Review of Systems  ?All other systems reviewed and are negative. ? ? ?   ?Objective:  ?Physical Exam ?Vitals and nursing note reviewed.  ?Constitutional:   ?   Appearance: She is well-developed.  ?Pulmonary:  ?   Effort: Pulmonary effort is normal.  ?Musculoskeletal:     ?   General: Normal range of motion.  ?Skin: ?   General: Skin is warm.  ?Neurological:  ?   Mental Status: She is alert.  ?  ?Neurovascular status intact muscle strength was found to be adequate range of motion within normal limits.  Patient is noted to have exquisite discomfort in the left sinus tarsi over the right and exquisite discomfort in the plantar heel region left with inflammation fluid of the medial band.  Moderate forefoot pain but not to the same degree and patient is found to have good digital perfusion well oriented x3 ? ?   ?Assessment:  ?Acute sinus tarsitis left along with Planter fasciitis left mild to moderate pain on the right not to the same degree with no forefoot pain noted currently patient ? ?   ?Plan:  ?PE x-rays reviewed all conditions discussed.  I am going to focus first on the left foot and she thought that she had uric acid but I checked her labs and I have never seen it we may order that in the future.  Today I am I went ahead and I did sterile prep and injected the sinus tarsi left 3 mg Kenalog 5 mg Xylocaine and I injected the plantar heel at insertion calcaneus 3 mg Kenalog 5 mg Xylocaine and applied fascial brace left along with supportive shoe gear and physical therapy.  Reappoint to recheck ? ?X-rays indicated large plantar  spur formation moderate flatfoot deformity bilateral no indications of advanced arthritis stress fracture and did discuss possible orthotics for the future ?   ? ? ?

## 2021-06-21 ENCOUNTER — Ambulatory Visit: Payer: BC Managed Care – PPO | Admitting: Podiatry

## 2022-02-12 ENCOUNTER — Other Ambulatory Visit (HOSPITAL_BASED_OUTPATIENT_CLINIC_OR_DEPARTMENT_OTHER): Payer: Self-pay

## 2022-02-12 DIAGNOSIS — G4700 Insomnia, unspecified: Secondary | ICD-10-CM

## 2022-08-02 ENCOUNTER — Other Ambulatory Visit: Payer: Self-pay | Admitting: Otolaryngology

## 2022-08-02 DIAGNOSIS — R1310 Dysphagia, unspecified: Secondary | ICD-10-CM

## 2022-08-02 DIAGNOSIS — E041 Nontoxic single thyroid nodule: Secondary | ICD-10-CM

## 2022-10-30 IMAGING — DX DG HIP (WITH OR WITHOUT PELVIS) 2-3V*L*
3 series · 3 of 3 positions shown · non-contrast
Comparison: None.

CLINICAL DATA: Left hip dislocation, bilateral hip arthroplasties

EXAM:
DG HIP (WITH OR WITHOUT PELVIS) 2-3V LEFT

[pelvis ap]
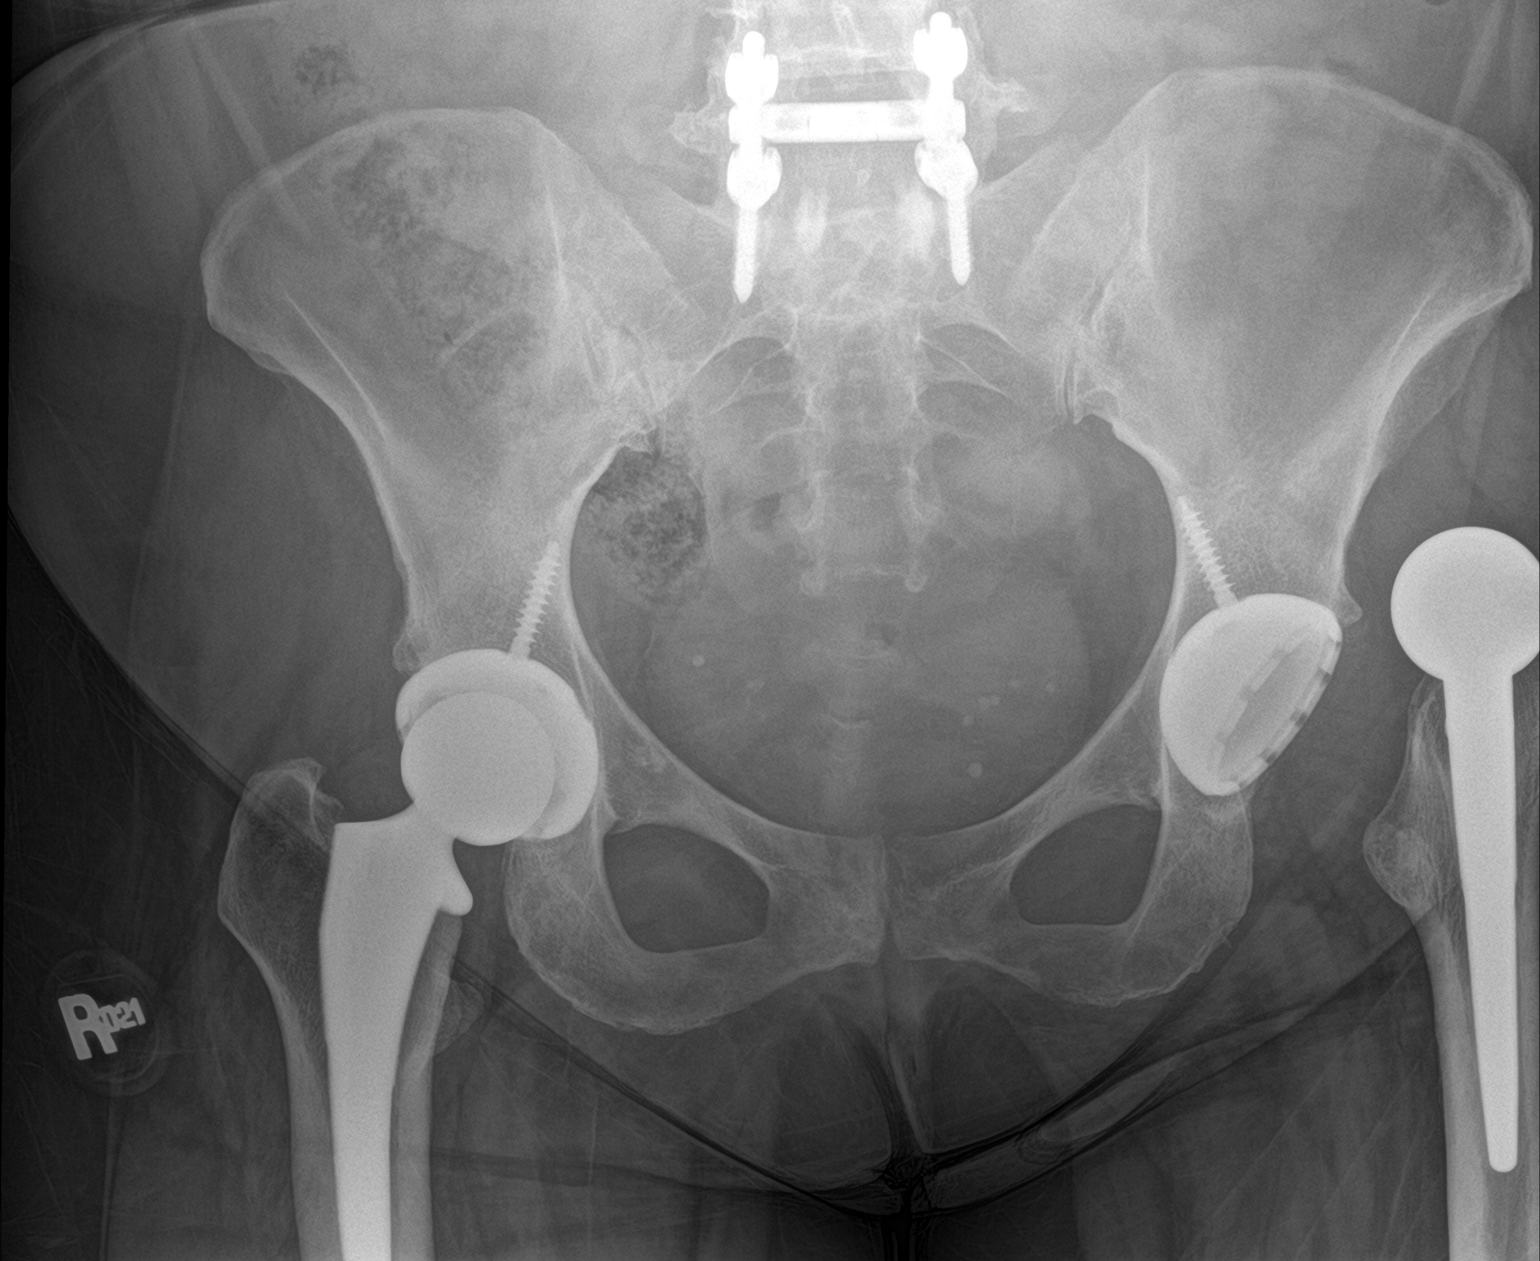

[hip ap]
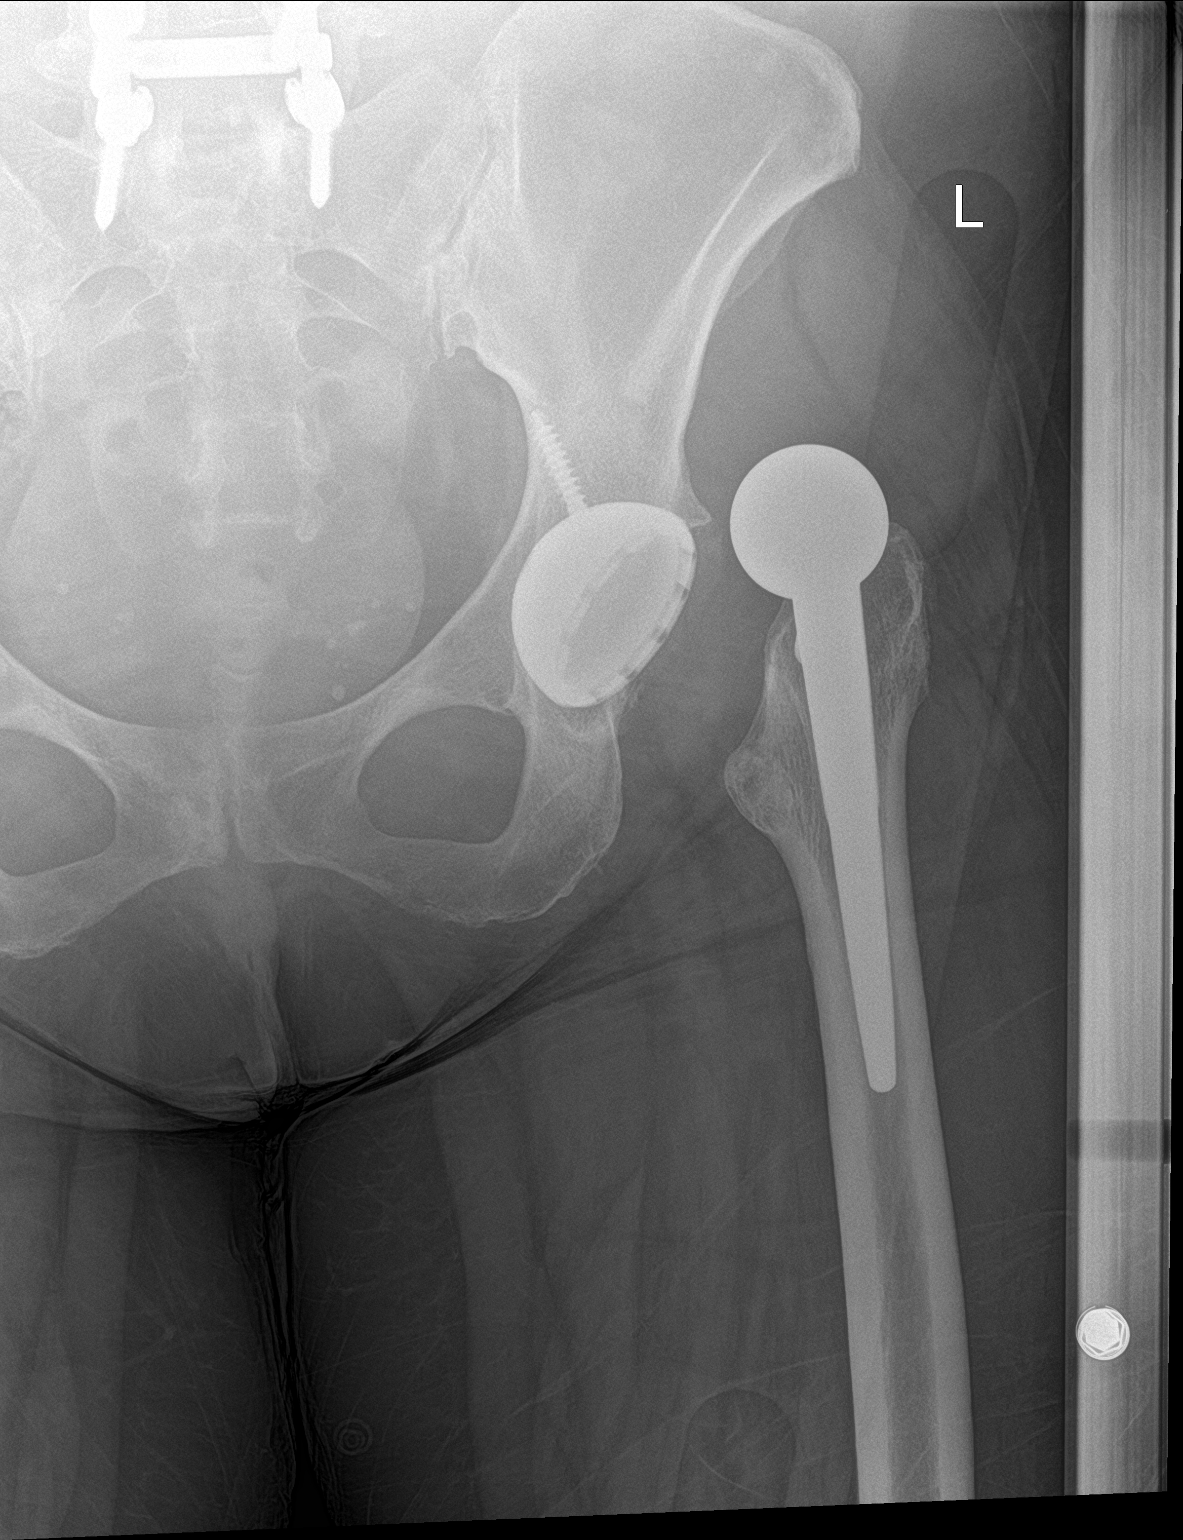

[hip lat]
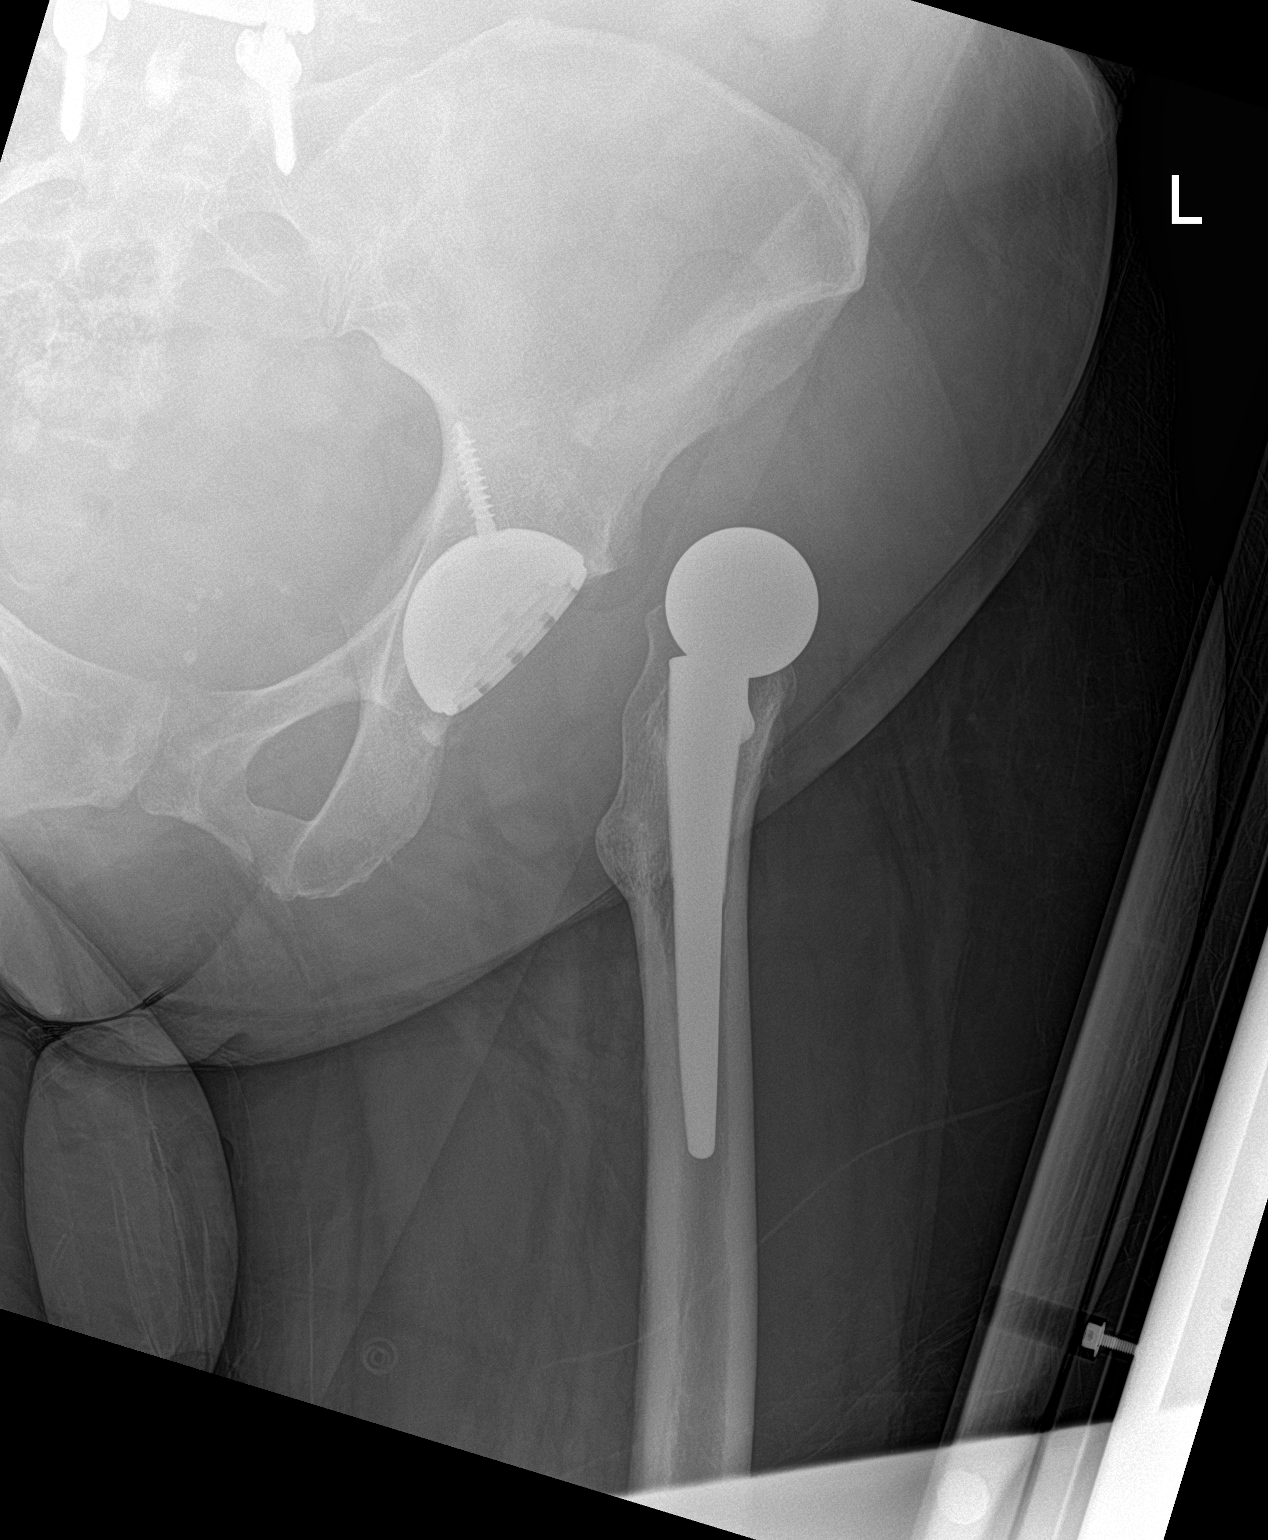

[3 of 3 positions shown; findings below may reference images not displayed]

FINDINGS: Frontal view of the pelvis as well as frontal and frogleg lateral
views of the left hip are obtained. There is superolateral
dislocation of the femoral component of the left hip arthroplasty. I
do not see any acute displaced fractures. The right hip prosthesis
remains well aligned. No acute bony abnormalities. Postsurgical
changes at the lumbosacral junction.
IMPRESSION: 1. Superolateral dislocation of the femoral component of the left
hip arthroplasty.

## 2022-11-01 IMAGING — CR DG HIP (WITH OR WITHOUT PELVIS) 2-3V*L*
3 series · 3 of 3 positions shown · non-contrast
Comparison: April 27, 2020.

CLINICAL DATA: Dislocation.

EXAM:
DG HIP (WITH OR WITHOUT PELVIS) 2-3V LEFT

[x pelvis]
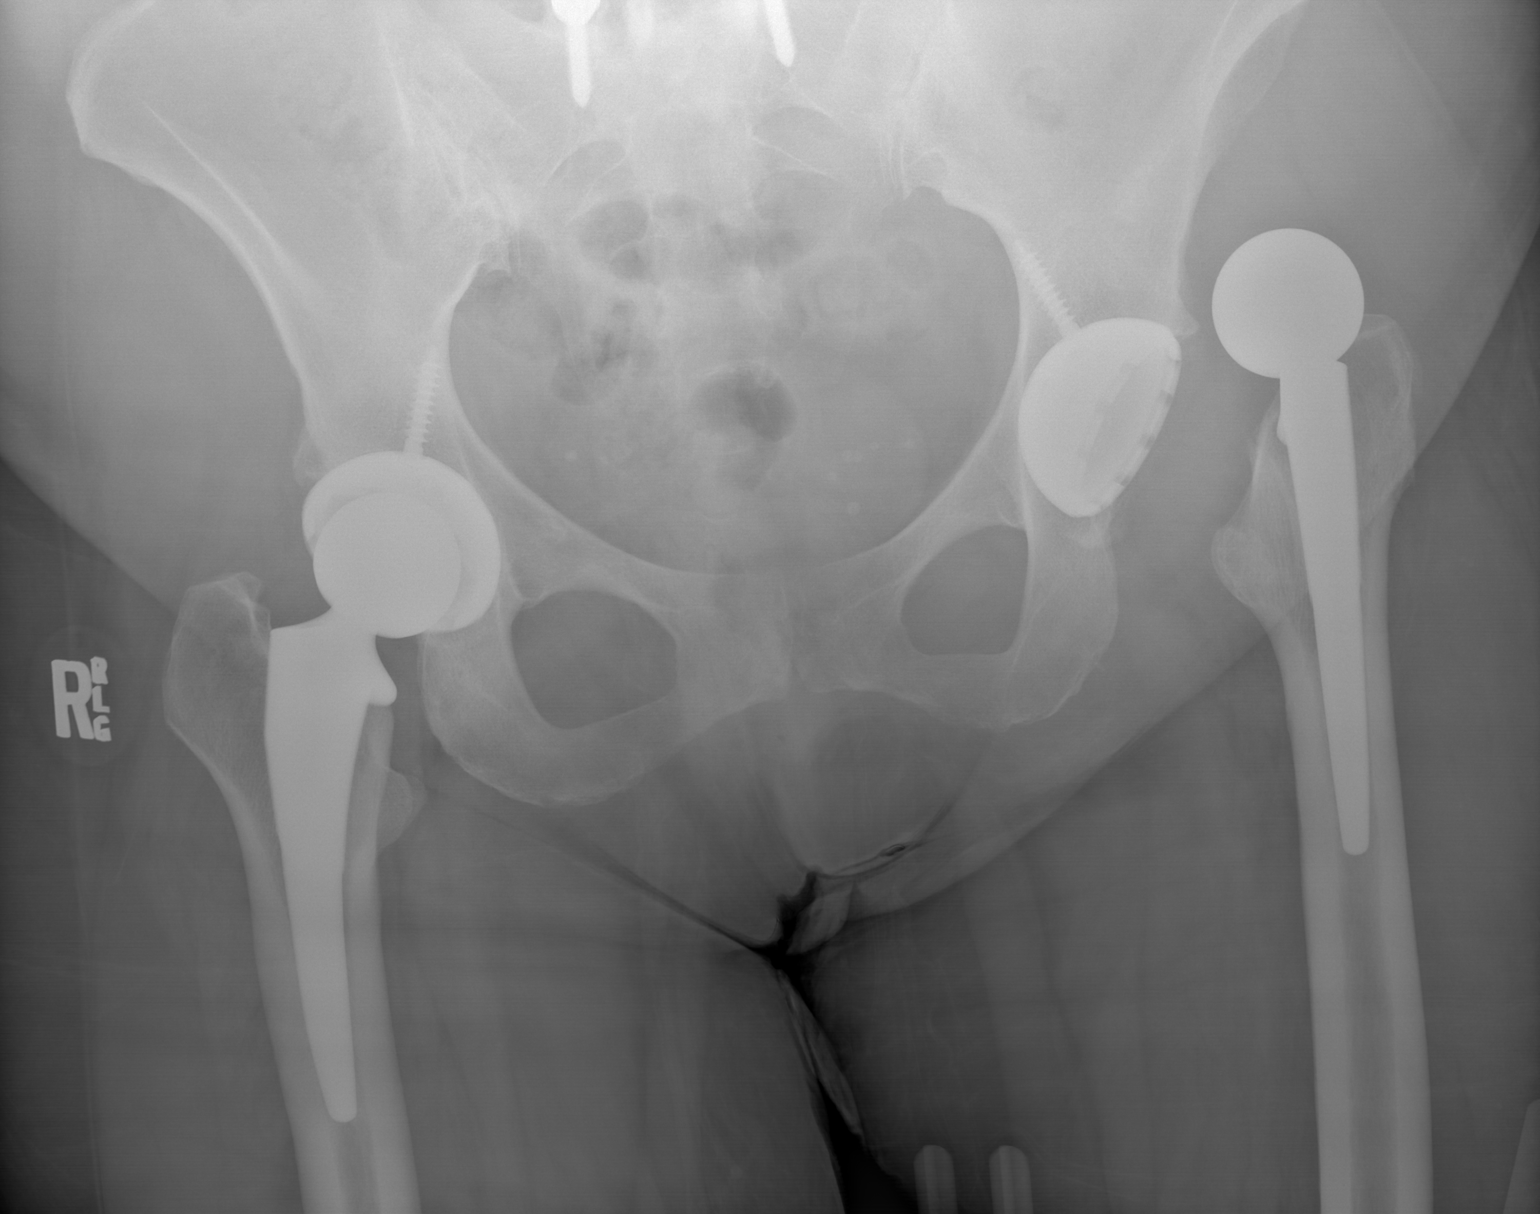

[x hip ap left]
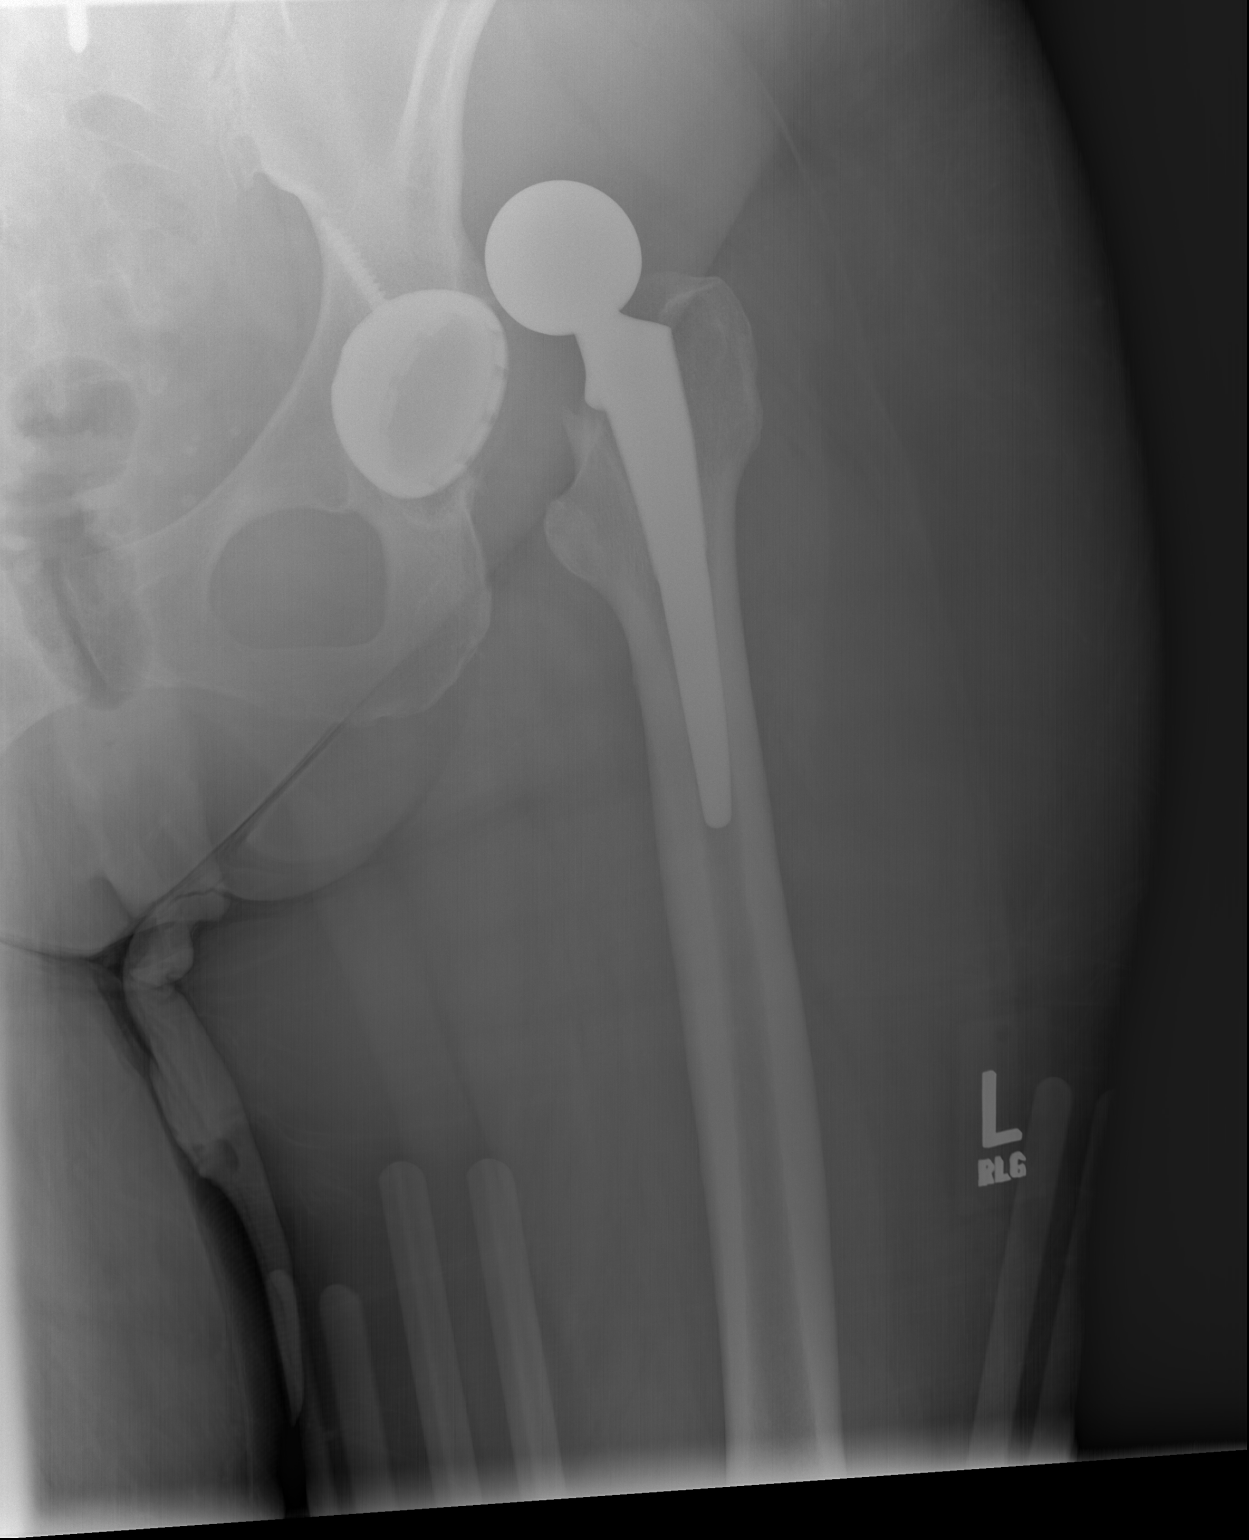

[w hip lat left]
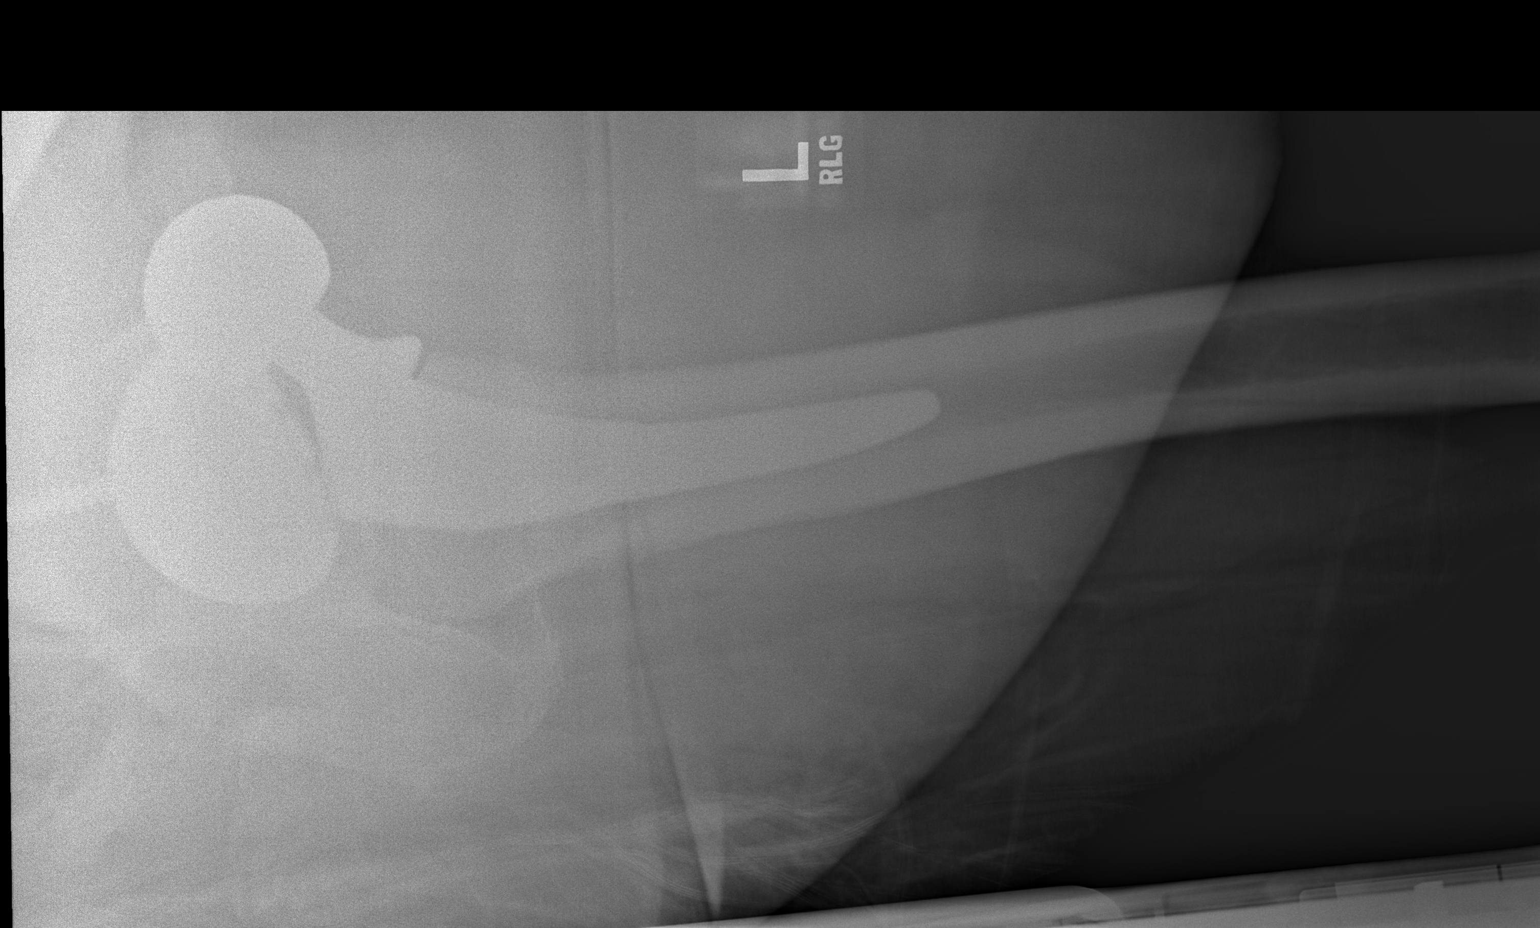

[3 of 3 positions shown; findings below may reference images not displayed]

FINDINGS: Status post bilateral total hip arthroplasties. There is noted
superior and lateral dislocation of the left femoral prosthesis
relative to the left acetabulum. No definite fracture is noted.
IMPRESSION: Superior and lateral dislocation of left femoral prosthesis relative
to the left acetabulum.

## 2022-11-01 IMAGING — DX DG HIP (WITH OR WITHOUT PELVIS) 1V PORT*L*
1 series · 1 of 1 positions shown · non-contrast
Comparison: Portable exam 9295 hours compared to earlier study at
1518 hours

CLINICAL DATA: Dislocated LEFT hip prosthesis post reduction

EXAM:
DG HIP (WITH OR WITHOUT PELVIS) 1V PORT LEFT

[hip ap]
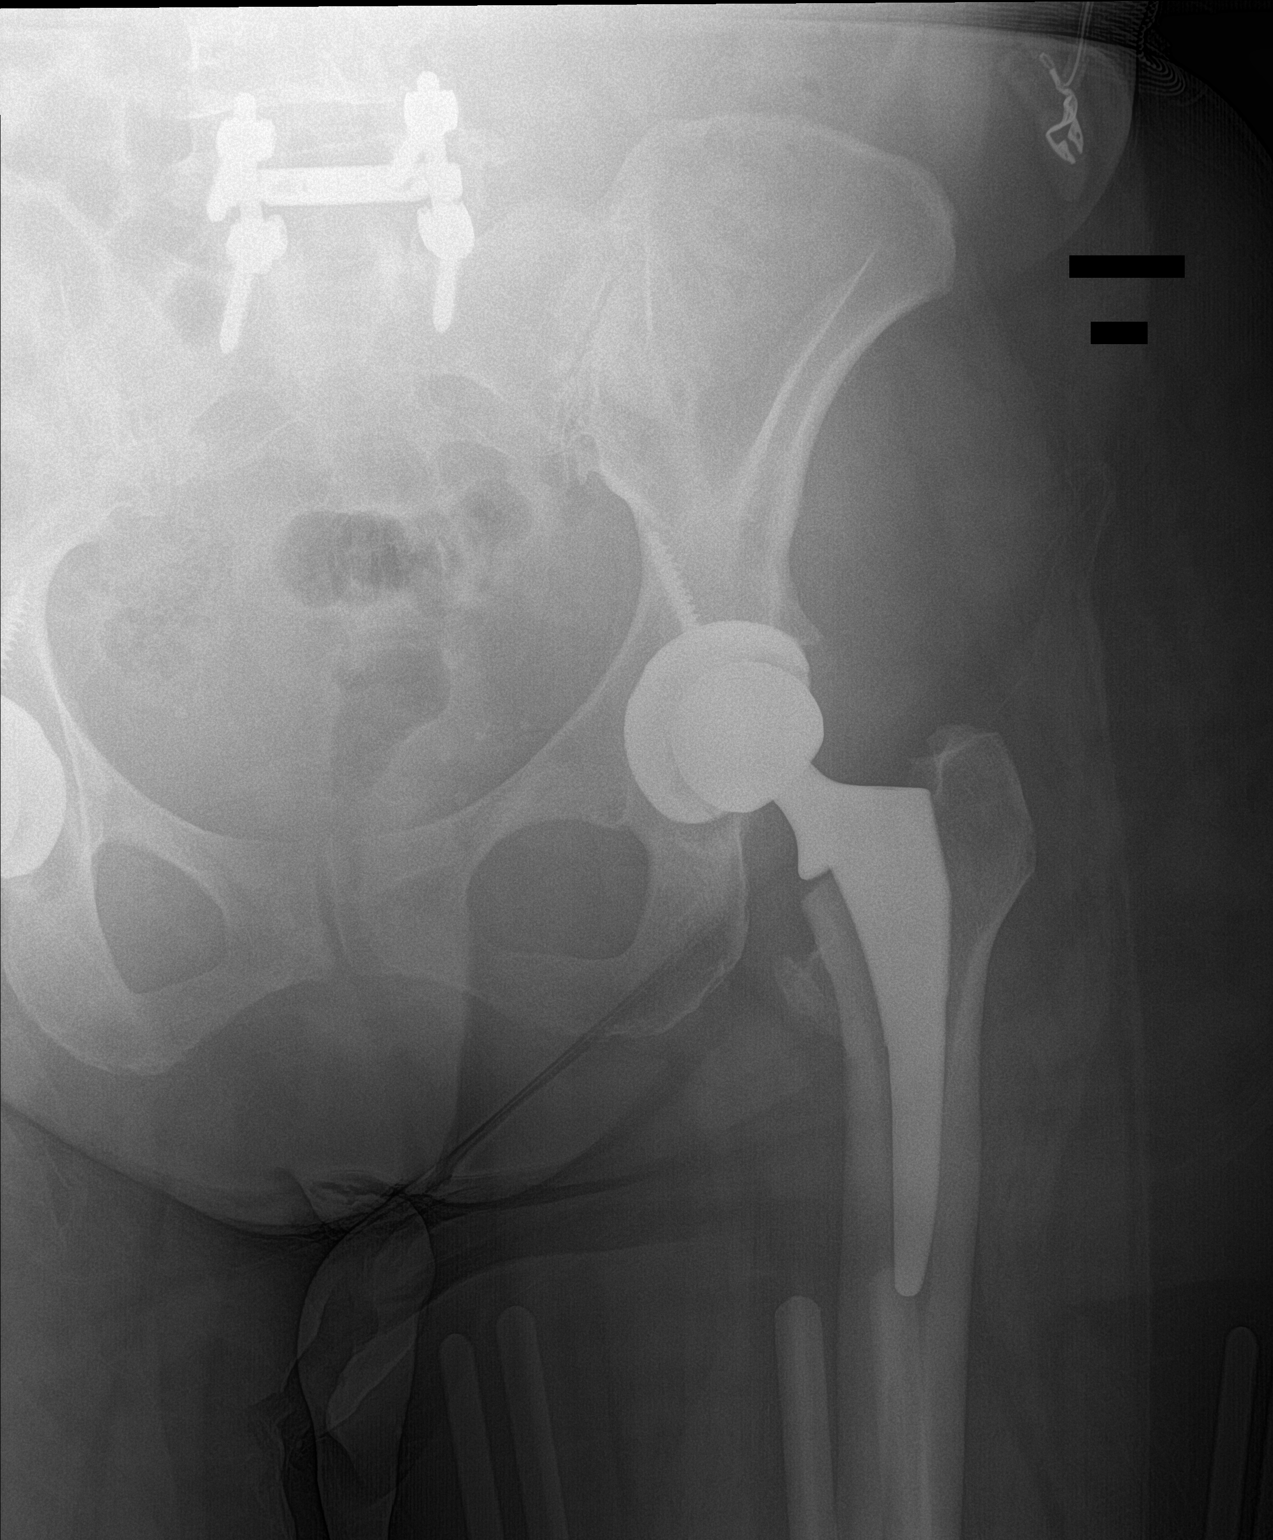

[1 of 1 positions shown; findings below may reference images not displayed]

FINDINGS: LEFT hip prosthesis now appears normally located on single AP view.

No fracture or dislocation identified.

Orthopedic hardware seen at L5-S1 and at RIGHT hip.
IMPRESSION: Previously identified dislocated LEFT hip prosthesis appears reduced
on single AP view.

## 2023-07-11 ENCOUNTER — Other Ambulatory Visit: Payer: Self-pay | Admitting: Otolaryngology

## 2023-07-11 DIAGNOSIS — E041 Nontoxic single thyroid nodule: Secondary | ICD-10-CM

## 2023-08-22 ENCOUNTER — Encounter: Payer: Self-pay | Admitting: Diagnostic Neuroimaging

## 2023-08-22 ENCOUNTER — Ambulatory Visit: Admitting: Diagnostic Neuroimaging

## 2023-08-22 VITALS — Ht 67.0 in | Wt 251.0 lb

## 2023-08-22 DIAGNOSIS — M5442 Lumbago with sciatica, left side: Secondary | ICD-10-CM | POA: Diagnosis not present

## 2023-08-22 DIAGNOSIS — R42 Dizziness and giddiness: Secondary | ICD-10-CM

## 2023-08-22 DIAGNOSIS — M542 Cervicalgia: Secondary | ICD-10-CM | POA: Diagnosis not present

## 2023-08-22 DIAGNOSIS — G894 Chronic pain syndrome: Secondary | ICD-10-CM

## 2023-08-22 DIAGNOSIS — R200 Anesthesia of skin: Secondary | ICD-10-CM

## 2023-08-22 DIAGNOSIS — G8929 Other chronic pain: Secondary | ICD-10-CM

## 2023-08-22 DIAGNOSIS — M5441 Lumbago with sciatica, right side: Secondary | ICD-10-CM

## 2023-08-22 NOTE — Progress Notes (Signed)
 [facility]  PATIENT: Taylor Gillespie DOB: 1965-06-01  REFERRING CLINICIAN: Cristopher Suzen HERO, NP  HISTORY FROM: patient and sister  REASON FOR VISIT: new consult    HISTORICAL  CHIEF COMPLAINT:  Chief Complaint  Patient presents with   Numbness    Rm 6 with sister [name] Pt is well, reports she has been having tingling, numbness and burning in the bottom of her feet. Having tingling in both hands. She mentions she has bone spares as well and restless legs.  She has been having dizziness since 2020. She noticed dizziness when she bends over, laying flat or turning to fast.     HISTORY OF PRESENT ILLNESS:   UPDATE (08/22/23, VRP): Since last visit, had ACDF C4-C7 in  Nov 2022. Sxs of dizziness, cramping in shoulders and forearms, numbness in hands and feet. Still with high stress levels at home. More depression and chronic pain issues. Poor sleep. Now on disability.   PRIOR HPI (06/23/21, Dr. Murrell): She was sent for nerve conductions with Dr. Atlas revealing bilateral carpal tunnel syndromes with no evidence of ulnar neuropathy. She has a C8-T1 radiculopathy or motor delay is 4.0 on the right 3.6 on the left. Delays of 3 2 and 2 7 latency of 3 9 and 3 for writer the right and left. Her ultrasound shows enlargement of the median nerve on her right side to 21 and 18 on her left. She has a history of borderline diabetes and arthritis. She states that the injection of the [facility] joint gave her some relief she continues to complain of numbness and tingling being awakened at night with the numbness and tingling median nerve distribution. She is aware that the ring and small fingers are probably coming from her neck. She is concerned about history of rheumatoid arthritis and would like to see a rheumatologist.  PRIOR HPI (10/17/18, VRP): 58 year old female here for evaluation of dizziness, tingling sensation.   March 2020 patient had onset of gait and balance difficulty.   When she would get out of her car or stand up she would feel dizzy.  She describes a off balance, lightheaded, confusion sensation.  Symptoms can occur if she moves too quickly, stands up quickly, walks.  She also has significant neck pain, shoulder pain, hip pain.  Patient has significant insomnia, depression, fatigue.  She feels swelling and aching sensation in her neck.  No vertigo, spinning sensation, nausea or vomiting.  Patient has tingling sensations in her fingers and feet.  Patient has history of lumbar spine surgery.  Patient has arthritis changes in her hands, hips, knees and shoulders.  Patient has seen pain management clinic, neurosurgery clinic, ENT.  MRI cervical spine shows mild spinal stenosis and multilevel foraminal stenosis.    REVIEW OF SYSTEMS: Full 14 system review of systems performed and negative with exception of: Weight gain fatigue ringing in ears snoring insomnia dizziness confusion depression not no sleep aching muscles smoking.  ALLERGIES: Allergies  Allergen Reactions   Diclofenac Shortness Of Breath, Itching and Nausea And Vomiting   Ferrous Sulfate      flushing   Morphine Nausea And Vomiting   Lisinopril Cough   Shellfish Allergy Rash    Sea food allergy.    HOME MEDICATIONS: Outpatient Medications Prior to Visit  Medication Sig Dispense Refill   acetaminophen  (TYLENOL ) 500 MG tablet Take 500 mg by mouth every 6 (six) hours as needed for moderate pain.     amLODipine (NORVASC) 10 MG tablet Take  10 mg by mouth daily.     cholecalciferol (VITAMIN D3) 25 MCG (1000 UNIT) tablet Take 1,000 Units by mouth daily.     diazepam  (VALIUM ) 5 MG tablet Take 5 mg by mouth at bedtime as needed for sleep.     FLUoxetine  (PROZAC ) 20 MG capsule Take 20 mg by mouth daily after lunch.      HYDROcodone -acetaminophen  (NORCO/VICODIN) 5-325 MG tablet Take 1 tablet by mouth every 4 (four) hours as needed for severe pain ((score 7 to 10)). 30 tablet 0   ibuprofen  (ADVIL) 200 MG tablet Take 400 mg by mouth every 6 (six) hours as needed for moderate pain.     magnesium  gluconate (MAGONATE) 500 MG tablet Take 500 mg by mouth every 3 (three) days.     minocycline (DYNACIN) 100 MG tablet Take 100 mg by mouth 2 (two) times daily.     naproxen (NAPROSYN) 500 MG tablet Take 500 mg by mouth 2 (two) times daily as needed for pain.     pantoprazole  (PROTONIX ) 40 MG tablet TAKE 1 TABLET (40 MG TOTAL) BY MOUTH DAILY. 30 tablet 5   Vitamin E  180 MG (400 UNIT) CAPS Take 400 Units by mouth daily.     cyclobenzaprine  (FLEXERIL ) 10 MG tablet Take 1 tablet (10 mg total) by mouth 3 (three) times daily as needed for muscle spasms. 30 tablet 0   docusate sodium  (COLACE) 100 MG capsule Take 1 capsule (100 mg total) by mouth 2 (two) times daily. (Patient not taking: No sig reported) 28 capsule 0   hydrochlorothiazide  (MICROZIDE ) 12.5 MG capsule Take 12.5 mg by mouth daily.     losartan  (COZAAR ) 50 MG tablet Take 50 mg by mouth daily.     polyethylene glycol (MIRALAX  / GLYCOLAX ) 17 g packet Take 17 g by mouth 2 (two) times daily. (Patient not taking: No sig reported) 28 packet 0   Potassium 99 MG TABS Take 99 mg by mouth daily.     vitamin C (ASCORBIC ACID ) 500 MG tablet Take 500 mg by mouth daily.     No facility-administered medications prior to visit.    PAST MEDICAL HISTORY: Past Medical History:  Diagnosis Date   Anxiety    Arthritis    Depression    Dizziness    Dizziness and giddiness    Endometriosis    Hypertension    Osteoarthritis    Presence of upper and lower permanent dental bridges    only perm upper bridge - per patient perm bridge is loose    Spinal stenosis, cervical region    C5-6, C6-7    PAST SURGICAL HISTORY: Past Surgical History:  Procedure Laterality Date   ANTERIOR CERVICAL DECOMP/DISCECTOMY FUSION N/A 12/30/2020   Procedure: Anterior Cervical Decompression Fusion -  - Cervical Four-Cervical Five - Cervical Five-Cervical  Six - Cervical  Six-Cervical Seven;  Surgeon: Onetha Kuba, MD;  Location: [facility];  Service: Neurosurgery;  Laterality: N/A;  Anterior Cervical Decompression Fusion -  - Cervical Four-Cervical Five - Cervical Five-Cervical  Six - Cervical Six-Cervical Seven   BACK SURGERY  2004   COLONOSCOPY  05/2014   gessner, repeat 5 yrs, [facility]   JOINT REPLACEMENT     TOTAL ABDOMINAL HYSTERECTOMY  2003   TOTAL HIP ARTHROPLASTY Left 04/30/2020   Procedure: TOTAL HIP ARTHROPLASTY ANTERIOR APPROACH/ EXCHANGE OF LEOTA MATHEW FRY FEMORAL HEAD;  Surgeon: Ernie Cough, MD;  Location: [facility];  Service: Orthopedics;  Laterality: Left;   UPPER GI ENDOSCOPY  05/2014   [facility]  FAMILY HISTORY: Family History  Problem Relation Age of Onset   Colon cancer Father        deceased   Diabetes Mellitus II Father    Hypertension Father    Diabetes Mother        deceased   Arthritis Mother    Hypertension Mother    Hypertension Brother    Hypertension Sister        two sisters   Breast cancer Maternal Grandmother     SOCIAL HISTORY: Social History   Socioeconomic History   Marital status: Married    Spouse name: Not on file   Number of children: 1   Years of education: 12   Highest education level: Not on file  Occupational History    Comment: na   Occupation: disabled  Tobacco Use   Smoking status: Former    Current packs/day: 0.00    Average packs/day: 0.5 packs/day for 30.0 years (15.0 ttl pk-yrs)    Types: Cigarettes    Start date: 25    Quit date: 2021    Years since quitting: 4.4   Smokeless tobacco: Never  Vaping Use   Vaping status: Never Used  Substance and Sexual Activity   Alcohol use: Yes    Alcohol/week: 0.0 standard drinks of alcohol    Comment: Occasional Beer/Wine   Drug use: No   Sexual activity: Yes    Birth control/protection: Other-see comments    Comment: Hysterectomy  Other Topics Concern   Not on file  Social History Narrative   2025- Lives with spouse   Caffeine- coffee 3 cups  daily   Social Drivers of Corporate investment banker Strain: Not on file  Food Insecurity: Not on file  Transportation Needs: Not on file  Physical Activity: Not on file  Stress: Not on file  Social Connections: Unknown (07/07/2021)   Received from [facility]   Social Network    Social Network: Not on file  Intimate Partner Violence: Unknown (06/02/2021)   Received from [facility]   HITS    Physically Hurt: Not on file    Insult or Talk Down To: Not on file    Threaten Physical Harm: Not on file    Scream or Curse: Not on file     PHYSICAL EXAM  GENERAL EXAM/CONSTITUTIONAL: Vitals:  Vitals:   08/22/23 0930  Weight: 251 lb (113.9 kg)  Height: 5' 7 (1.702 m)  Orthostatic VS for the past 24 hrs (Last 3 readings):  BP- Lying Pulse- Lying BP- Standing at 0 minutes Pulse- Standing at 0 minutes BP- Standing at 3 minutes Pulse- Standing at 3 minutes  08/22/23 0938 148/90 78 132/89 94 (!) 141/91 90    Body mass index is 39.31 kg/m. Wt Readings from Last 3 Encounters:  08/22/23 251 lb (113.9 kg)  12/30/20 246 lb (111.6 kg)  12/22/20 249 lb 8 oz (113.2 kg)   Patient is in no distress; well developed, nourished and groomed TIRED APPEARING  CARDIOVASCULAR: Examination of carotid arteries is normal; no carotid bruits Regular rate and rhythm, no murmurs Examination of peripheral vascular system by observation and palpation is normal  EYES: Ophthalmoscopic exam of optic discs and posterior segments is normal; no papilledema or hemorrhages No results found.  MUSCULOSKELETAL: Gait, strength, tone, movements noted in Neurologic exam below  NEUROLOGIC: MENTAL STATUS:      No data to display         awake, alert, oriented to person, place and time recent  and remote memory intact normal attention and concentration language fluent, comprehension intact, naming intact fund of knowledge appropriate  CRANIAL NERVE:  2nd - no papilledema on fundoscopic exam 2nd,  3rd, 4th, 6th - pupils equal and reactive to light, visual fields full to confrontation, extraocular muscles intact, no nystagmus 5th - facial sensation symmetric 7th - facial strength symmetric 8th - hearing intact 9th - palate elevates symmetrically, uvula midline 11th - shoulder shrug symmetric 12th - tongue protrusion midline  MOTOR:  normal bulk and tone, full strength in the BUE, BLE  SENSORY:  normal and symmetric to light touch, pinprick, temperature, vibration  COORDINATION:  finger-nose-finger, fine finger movements normal  REFLEXES:  deep tendon reflexes present and symmetric  GAIT/STATION:  narrow based gait; able to walk on toes, heels and tandem; romberg is negative     DIAGNOSTIC DATA (LABS, IMAGING, TESTING) - I reviewed patient records, labs, notes, testing and imaging myself where available.  Lab Results  Component Value Date   WBC 10.1 12/22/2020   HGB 14.5 12/22/2020   HCT 43.6 12/22/2020   MCV 87.0 12/22/2020   PLT 308 12/22/2020      Component Value Date/Time   NA 137 12/22/2020 1148   NA 139 10/17/2018 0935   K 3.5 12/22/2020 1148   CL 100 12/22/2020 1148   CO2 28 12/22/2020 1148   GLUCOSE 103 (H) 12/22/2020 1148   BUN 7 12/22/2020 1148   BUN 11 10/17/2018 0935   CREATININE 0.82 12/22/2020 1148   CALCIUM 9.3 12/22/2020 1148   PROT 6.0 (L) 05/08/2020 0151   PROT 7.3 10/17/2018 0935   ALBUMIN 3.3 (L) 05/08/2020 0151   ALBUMIN 4.7 10/17/2018 0935   AST 17 05/08/2020 0151   ALT 22 05/08/2020 0151   ALKPHOS 68 05/08/2020 0151   BILITOT 0.6 05/08/2020 0151   BILITOT 0.3 10/17/2018 0935   GFRNONAA >60 12/22/2020 1148   GFRAA >60 11/28/2018 1114   No results found for: CHOL, HDL, LDLCALC, LDLDIRECT, TRIG, CHOLHDL Lab Results  Component Value Date   HGBA1C 5.8 (H) 10/17/2018   Lab Results  Component Value Date   VITAMINB12 640 10/17/2018   Lab Results  Component Value Date   TSH 2.640 10/17/2018    08/28/18 MRI  cervical spine [I reviewed images myself and agree with interpretation. -VRP]  -Mild spinal stenosis at C5-6 and C6-7; foraminal stenosis at C5-6 and C6-7  10/17/20 MRI cervical spine - Multilevel degenerative changes as detailed above. Canal stenosis is greatest at C6-C7. Foraminal narrowing is greatest on the right at C5-C6 and on the left at C6-C7. Findings are not substantially progressed from the prior study.  10/17/20 MRI lumbar spine  - Degenerative and operative changes as detailed above without substantial change since the prior study. Facet arthropathy is greatest at L3-L4.  04/21/22 MRI cervical spine - Prior ACDF at C4-C7.  - There is facet arthropathy on the left at C3-4 with moderate left neuroforaminal stenosis.  - There is moderate right neuroforaminal stenosis at C5-6 due to a foraminal spur and moderate left neuroforaminal stenosis at C6-7 due to a foraminal spur.    ASSESSMENT AND PLAN  58 y.o. year old female here with constellation of symptoms including lightheadedness, gait difficulty, confusion, depression, insomnia, chronic pain.  History notable for smoking, weight gain, decreased activity, deconditioning.  No single unifying cause at this time.  Most likely presents combination of factors.  Meds tried: gabapentin, lyrica, prozac , hydrocodone , diazepam   Dx:  1. Chronic pain  syndrome   2. Dizziness   3. Neck pain   4. Chronic bilateral low back pain with bilateral sciatica   5. 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    EDUARD FABIENE HANLON, MD 08/22/2023, 10:43 AM Certified in Neurology, Neurophysiology and Neuroimaging  [facility] [address] [place], [place] [address] [phone]

## 2023-08-22 NOTE — Patient Instructions (Signed)
 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

## 2023-09-11 ENCOUNTER — Other Ambulatory Visit: Payer: Self-pay

## 2023-09-11 ENCOUNTER — Emergency Department (HOSPITAL_BASED_OUTPATIENT_CLINIC_OR_DEPARTMENT_OTHER): Admission: EM | Admit: 2023-09-11 | Discharge: 2023-09-11 | Disposition: A | Discharge status: Home / Self Care

## 2023-09-11 ENCOUNTER — Other Ambulatory Visit (HOSPITAL_BASED_OUTPATIENT_CLINIC_OR_DEPARTMENT_OTHER): Payer: Self-pay

## 2023-09-11 ENCOUNTER — Encounter (HOSPITAL_BASED_OUTPATIENT_CLINIC_OR_DEPARTMENT_OTHER): Payer: Self-pay

## 2023-09-11 DIAGNOSIS — I10 Essential (primary) hypertension: Secondary | ICD-10-CM | POA: Insufficient documentation

## 2023-09-11 DIAGNOSIS — Z87891 Personal history of nicotine dependence: Secondary | ICD-10-CM | POA: Insufficient documentation

## 2023-09-11 DIAGNOSIS — M545 Low back pain, unspecified: Secondary | ICD-10-CM | POA: Insufficient documentation

## 2023-09-11 MED ORDER — OXYCODONE HCL 5 MG PO TABS
5.0000 mg | ORAL_TABLET | Freq: Four times a day (QID) | ORAL | 0 refills | Status: DC | PRN
  Filled 2023-09-11: qty 10, 3d supply, fill #0

## 2023-09-11 MED ORDER — METHYLPREDNISOLONE SODIUM SUCC 125 MG IJ SOLR
60.0000 mg | Freq: Once | INTRAMUSCULAR | Status: AC
Start: 2023-09-11 — End: 2023-09-11
  Administered 2023-09-11: 60 mg via INTRAMUSCULAR
  Filled 2023-09-11: qty 2

## 2023-09-11 MED ORDER — PREDNISONE 10 MG PO TABS
ORAL_TABLET | Freq: Every day | ORAL | 0 refills | Status: DC
  Filled 2023-09-11: qty 42, 12d supply, fill #0

## 2023-09-11 MED ORDER — PREDNISONE 10 MG PO TABS: 10.0000 mg | ORAL_TABLET | Freq: Every day | ORAL | 0 refills | Status: AC

## 2023-09-11 MED ORDER — OXYCODONE HCL 5 MG PO TABS: 5.0000 mg | ORAL_TABLET | Freq: Four times a day (QID) | ORAL | 0 refills | Status: AC | PRN

## 2023-09-11 MED ORDER — CYCLOBENZAPRINE HCL 10 MG PO TABS
10.0000 mg | ORAL_TABLET | Freq: Once | ORAL | Status: AC
  Administered 2023-09-11: 10 mg via ORAL
  Filled 2023-09-11: qty 1

## 2023-09-11 MED ORDER — OXYCODONE-ACETAMINOPHEN 5-325 MG PO TABS: 1.0000 | ORAL_TABLET | Freq: Once | ORAL | Status: DC

## 2023-09-11 NOTE — ED Provider Notes (Signed)
 [place] EMERGENCY DEPARTMENT AT [facility] Provider Note   CSN: [id number] Arrival date & time: 09/11/23  1533     Patient presents with: Hip Pain   Taylor Gillespie is a 58 y.o. female.    Hip Pain   58 year old female presents emergency department with complaints of right low back pain with radiation to right hip.  States that symptoms present for the past few days with worsening today.  States that this began after she was cleaning her car and worsened as the day went on.  Denies any saddle anesthesia, bowel/bladder dysfunction, weakness/sensory deficits in lower extremities, fever, history of IV drug use, prolonged corticosteroid use, known malignancy.  Does report history of bilateral hip replacement.  States that she had MRI about a week ago but did not heard back results there is concern that there may be complication.  Denies any known falls/traumas.  Denies any urinary symptoms, abdominal pain.  States that she had some pain medication leftover from her procedure which has not been helping very much with her symptoms.  States that she has had symptoms similar in the past although not quite as intense which did improve with steroids.  Past medical history significant for osteoarthritis, hypertension, spinal stenosis, chronic back pain  Prior to Admission medications   Medication Sig Start Date End Date Taking? Authorizing Provider  oxyCODONE  (ROXICODONE ) 5 MG immediate release tablet Take 1 tablet (5 mg total) by mouth every 6 (six) hours as needed for severe pain (pain score 7-10). 09/11/23  Yes Silver Fell A, PA  predniSONE  (DELTASONE ) 10 MG tablet Take by mouth daily. Take 6 tabs by mouth daily  for 2 days, then 5 tabs for 2 days, then 4 tabs for 2 days, then 3 tabs for 2 days, 2 tabs for 2 days, then 1 tab by mouth daily for 2 days 09/11/23  Yes Silver Fell A, PA  acetaminophen  (TYLENOL ) 500 MG tablet Take 500 mg by mouth every 6 (six) hours as needed for  moderate pain.    [provider]  amLODipine (NORVASC) 10 MG tablet Take 10 mg by mouth daily.    [provider]  cholecalciferol (VITAMIN D3) 25 MCG (1000 UNIT) tablet Take 1,000 Units by mouth daily.    [provider]  diazepam  (VALIUM ) 5 MG tablet Take 5 mg by mouth at bedtime as needed for sleep. 07/02/20   [provider]  FLUoxetine  (PROZAC ) 20 MG capsule Take 20 mg by mouth daily after lunch.  11/10/15   [provider]  HYDROcodone -acetaminophen  (NORCO/VICODIN) 5-325 MG tablet Take 1 tablet by mouth every 4 (four) hours as needed for severe pain ((score 7 to 10)). 12/31/20   Meyran, Suzen Lacks, NP  ibuprofen (ADVIL) 200 MG tablet Take 400 mg by mouth every 6 (six) hours as needed for moderate pain.    [provider]  magnesium  gluconate (MAGONATE) 500 MG tablet Take 500 mg by mouth every 3 (three) days.    [provider]  minocycline (DYNACIN) 100 MG tablet Take 100 mg by mouth 2 (two) times daily. 05/15/23   [provider]  naproxen (NAPROSYN) 500 MG tablet Take 500 mg by mouth 2 (two) times daily as needed for pain. 09/25/20   [provider]  pantoprazole  (PROTONIX ) 40 MG tablet TAKE 1 TABLET (40 MG TOTAL) BY MOUTH DAILY. 06/01/15   [facility] Lupita BRAVO, MD  Vitamin E  180 MG (400 UNIT) CAPS Take 400 Units by mouth daily.  [provider]    Allergies: Diclofenac, Ferrous sulfate , Morphine, Lisinopril, and Shellfish allergy    Review of Systems  All other systems reviewed and are negative.   Updated Vital Signs BP 132/76   Pulse 72   Temp 98.3 F (36.8 C) (Temporal)   Resp 19   Ht 5' 7 (1.702 m)   Wt 113.9 kg   LMP  (LMP Unknown)   SpO2 100%   BMI 39.31 kg/m   Physical Exam Vitals and nursing note reviewed.  Constitutional:      General: She is not in acute distress.    Appearance: She is well-developed.  HENT:     Head: Normocephalic and atraumatic.  Eyes:      Conjunctiva/sclera: Conjunctivae normal.  Cardiovascular:     Rate and Rhythm: Normal rate and regular rhythm.     Heart sounds: No murmur heard. Pulmonary:     Effort: Pulmonary effort is normal. No respiratory distress.     Breath sounds: Normal breath sounds.  Abdominal:     Palpations: Abdomen is soft.     Tenderness: There is no abdominal tenderness.  Musculoskeletal:        General: No swelling.     Cervical back: Neck supple.     Comments: No midline tenderness lumbar spine.  Paraspinal tenderness noted right lumbar spine with tenderness right buttock.  Muscular strength symmetric lower extremities.  DTR symmetric at patella.  Pedal and posttibial pulses 2+ bilaterally.  No sensory deficits along major nerve issues in the lower extremities.  Patient able to ambulate independently but with limping gait.  Skin:    General: Skin is warm and dry.     Capillary Refill: Capillary refill takes less than 2 seconds.  Neurological:     Mental Status: She is alert.  Psychiatric:        Mood and Affect: Mood normal.     (all labs ordered are listed, but only abnormal results are displayed) Labs Reviewed - No data to display  EKG: None  Radiology: No results found.   Procedures   Medications Ordered in the ED  methylPREDNISolone  sodium succinate (SOLU-MEDROL ) 125 mg/2 mL injection 60 mg (60 mg Intramuscular Given 09/11/23 1704)  cyclobenzaprine  (FLEXERIL ) tablet 10 mg (10 mg Oral Given 09/11/23 1702)                                    Medical Decision Making Risk Prescription drug management.   This patient presents to the ED for concern of back pain, this involves an extensive number of treatment options, and is a complaint that carries with it a high risk of complications and morbidity.  The differential diagnosis includes fracture, strain/pain, dislocation, cauda equina, spinal epidural abscess, other spinal cord compression/impingement, pyelonephritis, nephrolithiasis,  aortic dissection, other   Co morbidities that complicate the patient evaluation  See HPI   Additional history obtained:  Additional history obtained from EMR External records from outside source obtained and reviewed including hospital records   Lab Tests:  N/a   Imaging Studies ordered:  N/a   Cardiac Monitoring: / EKG:  N/a   Consultations Obtained:  N/a   Problem List / ED Course / Critical interventions / Medication management  Right low back pain I ordered medication including Flexeril , Solu-Medrol   Reevaluation of the patient after these medicines showed that the patient improved I have reviewed the patients home medicines and  have made adjustments as needed   Social Determinants of Health:  Former cigarette use.  Denies illicit drug use.   Test / Admission - Considered:  Right low back pain Vitals signs \\within  normal range and stable throughout visit. 58 year old female presents emergency department with complaints of right low back pain with radiation to right hip.  States that symptoms present for the past few days with worsening today.  States that this began after she was cleaning her car and worsened as the day went on.  Denies any saddle anesthesia, bowel/bladder dysfunction, weakness/sensory deficits in lower extremities, fever, history of IV drug use, prolonged corticosteroid use, known malignancy.  Does report history of bilateral hip replacement.  States that she had MRI about a week ago but did not heard back results there is concern that there may be complication.  Denies any known falls/traumas.  Denies any urinary symptoms, abdominal pain.  States that she had some pain medication leftover from her procedure which has not been helping very much with her symptoms.  States that she has had symptoms similar in the past although not quite as intense which did improve with steroids. On exam, paraspinal tenderness in the right lumbar region with  some tenderness in right buttock.  No red flag signs or back pain on HPI/PE; low suspicion for cauda equina, spinal dural abscess; transferred for emergent MRI at this point deemed unnecessary..  Patient without abdominal pain radiating back, pulse deficits, neurodeficits; low suspicion for aortic dissection.  Patient without urinary symptoms, intra-abdominal pain/tenderness; low suspicion for pyelonephritis/nephrolithiasis.  Suspect muscular etiology versus sciatic although patient's radiating pain only goes to about proximal third thigh.  Will trial prednisone  therapy given patient's allergy taking some NSAIDs.  Will recommend rehab exercises at home and follow-up with PCP/spinal specialist in the outpatient setting.  Treatment plan discussed with patient and treatment understanding was agreeable to said plan.  Patient overall well-appearing, afebrile in no acute distress. Worrisome signs and symptoms were discussed with the patient, and the patient acknowledged understanding to return to the ED if noticed. Patient was stable upon discharge.       Final diagnoses:  None    ED Discharge Orders          Ordered    predniSONE  (DELTASONE ) 10 MG tablet  Daily        09/11/23 1703    oxyCODONE  (ROXICODONE ) 5 MG immediate release tablet  Every 6 hours PRN        09/11/23 1703               Silver Wonda LABOR, PA 09/11/23 1728    Neysa Caron PARAS, DO 09/11/23 2345

## 2023-09-11 NOTE — ED Notes (Signed)
 Pt refusing x-ray in triage. Seeking MRI. Pt informed MRI services are not available at this facility today. Pt verbalized understanding.

## 2023-09-11 NOTE — ED Triage Notes (Signed)
 Pt presents c/o possible R sided sciatica/hip pain that began yesterday, worsening, ongoing. Reports hx same but says this is the worst it's been. Describes as stabbing pain, reports difficulty walking, standing, moving. MRI of bilateral hips last week. Hx bilateral hip implants.

## 2023-09-11 NOTE — Discharge Instructions (Addendum)
 As discussed, we will treat your symptoms at home with a prednisone  taper as well as pain medication.  Recommend follow-up with primary care or spinal specialist in the outpatient setting for reassessment.  Also recommend doing rehab exercises at home.  Please not hesitate to return to the emergency department if the worrisome signs and symptoms we discussed become apparent.

## 2023-09-12 ENCOUNTER — Other Ambulatory Visit (HOSPITAL_COMMUNITY): Payer: Self-pay

## 2023-09-12 ENCOUNTER — Other Ambulatory Visit (HOSPITAL_BASED_OUTPATIENT_CLINIC_OR_DEPARTMENT_OTHER): Payer: Self-pay
